# Patient Record
Sex: Male | Born: 1940 | Race: White | Hispanic: No | Marital: Married | Smoking: Former smoker
Health system: Southern US, Community
[De-identification: ages and names within clinical notes are randomized; demographics above are authoritative.]

## PROBLEM LIST (undated history)

## (undated) DIAGNOSIS — M1990 Unspecified osteoarthritis, unspecified site: Secondary | ICD-10-CM

## (undated) DIAGNOSIS — M3320 Polymyositis, organ involvement unspecified: Secondary | ICD-10-CM

## (undated) DIAGNOSIS — H9190 Unspecified hearing loss, unspecified ear: Secondary | ICD-10-CM

## (undated) HISTORY — PX: APPENDECTOMY: SHX54

---

## 2012-01-15 ENCOUNTER — Ambulatory Visit: Payer: Self-pay | Admitting: Unknown Physician Specialty

## 2014-02-18 ENCOUNTER — Ambulatory Visit: Payer: Self-pay | Admitting: Internal Medicine

## 2014-07-29 ENCOUNTER — Other Ambulatory Visit
Admit: 2014-07-29 | Disposition: A | Discharge status: Home / Self Care | Payer: Self-pay | Attending: Internal Medicine | Admitting: Internal Medicine

## 2014-07-29 ENCOUNTER — Ambulatory Visit
Admit: 2014-07-29 | Disposition: A | Discharge status: Home / Self Care | Payer: Self-pay | Attending: Internal Medicine | Admitting: Internal Medicine

## 2014-07-29 LAB — COMPREHENSIVE METABOLIC PANEL
ALBUMIN: 4.6 g/dL
ALT: 16 U/L — AB
AST: 22 U/L
Alkaline Phosphatase: 82 U/L
Anion Gap: 11 (ref 7–16)
BUN: 12 mg/dL
Bilirubin,Total: 0.5 mg/dL
CALCIUM: 9.5 mg/dL
Chloride: 101 mmol/L
Co2: 27 mmol/L
Creatinine: 0.82 mg/dL
EGFR (Non-African Amer.): >60
GLUCOSE: 85 mg/dL
Potassium: 4.3 mmol/L
Sodium: 139 mmol/L
TOTAL PROTEIN: 7.4 g/dL

## 2015-04-09 ENCOUNTER — Inpatient Hospital Stay
Admission: EM | Admit: 2015-04-09 | Discharge: 2015-04-13 | DRG: 379 | Disposition: A | Discharge status: Home / Self Care | Payer: Medicare Other | Attending: Internal Medicine | Admitting: Internal Medicine

## 2015-04-09 ENCOUNTER — Encounter: Payer: Self-pay | Admitting: *Deleted

## 2015-04-09 DIAGNOSIS — R42 Dizziness and giddiness: Secondary | ICD-10-CM | POA: Diagnosis not present

## 2015-04-09 DIAGNOSIS — K5711 Diverticulosis of small intestine without perforation or abscess with bleeding: Secondary | ICD-10-CM | POA: Diagnosis not present

## 2015-04-09 DIAGNOSIS — T39395A Adverse effect of other nonsteroidal anti-inflammatory drugs [NSAID], initial encounter: Secondary | ICD-10-CM | POA: Diagnosis present

## 2015-04-09 DIAGNOSIS — D649 Anemia, unspecified: Secondary | ICD-10-CM | POA: Diagnosis present

## 2015-04-09 DIAGNOSIS — Z7982 Long term (current) use of aspirin: Secondary | ICD-10-CM

## 2015-04-09 DIAGNOSIS — Z87891 Personal history of nicotine dependence: Secondary | ICD-10-CM

## 2015-04-09 DIAGNOSIS — R000 Tachycardia, unspecified: Secondary | ICD-10-CM

## 2015-04-09 DIAGNOSIS — K921 Melena: Secondary | ICD-10-CM | POA: Diagnosis present

## 2015-04-09 DIAGNOSIS — D72829 Elevated white blood cell count, unspecified: Secondary | ICD-10-CM | POA: Diagnosis present

## 2015-04-09 DIAGNOSIS — M1611 Unilateral primary osteoarthritis, right hip: Secondary | ICD-10-CM | POA: Diagnosis present

## 2015-04-09 DIAGNOSIS — F439 Reaction to severe stress, unspecified: Secondary | ICD-10-CM | POA: Diagnosis present

## 2015-04-09 DIAGNOSIS — K2970 Gastritis, unspecified, without bleeding: Secondary | ICD-10-CM | POA: Diagnosis present

## 2015-04-09 DIAGNOSIS — K449 Diaphragmatic hernia without obstruction or gangrene: Secondary | ICD-10-CM | POA: Diagnosis present

## 2015-04-09 HISTORY — DX: Unspecified osteoarthritis, unspecified site: M19.90

## 2015-04-09 LAB — CBC
HCT: 33.8 % — ABNORMAL LOW (ref 40.0–52.0)
Hemoglobin: 11.1 g/dL — ABNORMAL LOW (ref 13.0–18.0)
MCH: 29.5 pg (ref 26.0–34.0)
MCHC: 32.9 g/dL (ref 32.0–36.0)
MCV: 89.7 fL (ref 80.0–100.0)
PLATELETS: 427 10*3/uL (ref 150–440)
RBC: 3.77 MIL/uL — ABNORMAL LOW (ref 4.40–5.90)
RDW: 13.2 % (ref 11.5–14.5)
WBC: 14.6 10*3/uL — AB (ref 3.8–10.6)

## 2015-04-09 LAB — BASIC METABOLIC PANEL
Anion gap: 9 (ref 5–15)
BUN: 38 mg/dL — AB (ref 6–20)
CO2: 27 mmol/L (ref 22–32)
CREATININE: 0.84 mg/dL (ref 0.61–1.24)
Calcium: 8.8 mg/dL — ABNORMAL LOW (ref 8.9–10.3)
Chloride: 103 mmol/L (ref 101–111)
GFR calc Af Amer: >60 mL/min (ref >60)
Glucose, Bld: 113 mg/dL — ABNORMAL HIGH (ref 65–99)
POTASSIUM: 3.7 mmol/L (ref 3.5–5.1)
SODIUM: 139 mmol/L (ref 135–145)

## 2015-04-09 LAB — PROTIME-INR
INR: 1.03
Prothrombin Time: 13.7 seconds (ref 11.4–15.0)

## 2015-04-09 LAB — APTT: aPTT: 29 seconds (ref 24–36)

## 2015-04-09 LAB — HEMOGLOBIN: Hemoglobin: 9.5 g/dL — ABNORMAL LOW (ref 13.0–18.0)

## 2015-04-09 LAB — ABO/RH: ABO/RH(D): A POS

## 2015-04-09 MED ORDER — ONDANSETRON HCL 4 MG PO TABS: 4.0000 mg | ORAL_TABLET | Freq: Four times a day (QID) | ORAL | Status: DC | PRN

## 2015-04-09 MED ORDER — SODIUM CHLORIDE 0.9 % IJ SOLN
3.0000 mL | Freq: Two times a day (BID) | INTRAMUSCULAR | Status: DC
  Administered 2015-04-09 – 2015-04-12 (×2): 3 mL via INTRAVENOUS

## 2015-04-09 MED ORDER — ONDANSETRON HCL 4 MG/2ML IJ SOLN: 4.0000 mg | Freq: Four times a day (QID) | INTRAMUSCULAR | Status: DC | PRN

## 2015-04-09 MED ORDER — SODIUM CHLORIDE 0.9 % IV SOLN
8.0000 mg/h | INTRAVENOUS | Status: DC
  Administered 2015-04-09 – 2015-04-12 (×5): 8 mg/h via INTRAVENOUS
  Filled 2015-04-09 (×6): qty 80

## 2015-04-09 MED ORDER — SODIUM CHLORIDE 0.9 % IV BOLUS (SEPSIS)
1000.0000 mL | Freq: Once | INTRAVENOUS | Status: AC
Start: 2015-04-09 — End: 2015-04-09
  Administered 2015-04-09: 1000 mL via INTRAVENOUS

## 2015-04-09 MED ORDER — OXYCODONE HCL 5 MG PO TABS: 5.0000 mg | ORAL_TABLET | ORAL | Status: DC | PRN

## 2015-04-09 MED ORDER — ACETAMINOPHEN 325 MG PO TABS
650.0000 mg | ORAL_TABLET | Freq: Four times a day (QID) | ORAL | Status: DC | PRN
  Administered 2015-04-10: 650 mg via ORAL
  Filled 2015-04-09: qty 2

## 2015-04-09 MED ORDER — SODIUM CHLORIDE 0.9 % IV SOLN: 80.0000 mg | Freq: Once | INTRAVENOUS | Status: DC

## 2015-04-09 MED ORDER — SODIUM CHLORIDE 0.9 % IV SOLN
80.0000 mg | Freq: Once | INTRAVENOUS | Status: AC
  Administered 2015-04-09: 80 mg via INTRAVENOUS
  Filled 2015-04-09: qty 80

## 2015-04-09 MED ORDER — SODIUM CHLORIDE 0.9 % IV SOLN
INTRAVENOUS | Status: DC
  Administered 2015-04-09 – 2015-04-11 (×5): via INTRAVENOUS
  Administered 2015-04-11: 1000 mL via INTRAVENOUS
  Administered 2015-04-12 – 2015-04-13 (×3): via INTRAVENOUS

## 2015-04-09 MED ORDER — ACETAMINOPHEN 650 MG RE SUPP: 650.0000 mg | Freq: Four times a day (QID) | RECTAL | Status: DC | PRN

## 2015-04-09 NOTE — ED Provider Notes (Signed)
[facility] Emergency Department Provider Note  ____________________________________________  Time seen: Approximately 1:08 PM  I have reviewed the triage vital signs and the nursing notes.   HISTORY  Chief Complaint Rectal Bleeding    HPI Charles Davila is a 74 y.o. male presenting with lightheadedness and melena. Patient states that he takes a lot of aspirin and Aleve. This morning he bent over to put some wood chips and a dog house and had acute lightheadedness. He drove home but the lightheadedness persisted, and was followed by a large dark and tarry stool. No bright red blood. No recent epigastric pain. No fever, abdominal pain, chest pain, palpitations, shortness of breath.   History reviewed. No pertinent past medical history.  There are no active problems to display for this patient.   Past Surgical History  Procedure Laterality Date  . Appendectomy      No current outpatient prescriptions on file.  Allergies Review of patient's allergies indicates no known allergies.  No family history on file.  Social History Social History  Substance Use Topics  . Smoking status: Former Games developer  . Smokeless tobacco: None  . Alcohol Use: No    Review of Systems Constitutional: No fever/chills. Positive lightheadedness. Eyes: No visual changes. ENT: No sore throat. Cardiovascular: Denies chest pain, palpitations. Respiratory: Denies shortness of breath.  No cough. Gastrointestinal: No abdominal pain.  No nausea, no vomiting.  No diarrhea.  No constipation. Positive melena. Genitourinary: Negative for dysuria. Musculoskeletal: Negative for back pain. Skin: Negative for rash. Neurological: Negative for headaches, focal weakness or numbness.  10-point ROS otherwise negative.  ____________________________________________   PHYSICAL EXAM:  VITAL SIGNS: ED Triage Vitals  Enc Vitals Group     BP 04/09/15 1240 155/86 mmHg     Pulse Rate  04/09/15 1240 114     Resp 04/09/15 1240 20     Temp 04/09/15 1240 97.6 F (36.4 C)     Temp Source 04/09/15 1240 Oral     SpO2 04/09/15 1240 98 %     Weight 04/09/15 1240 160 lb (72.576 kg)     Height 04/09/15 1240  (1.727 m)     Head Cir --      Peak Flow --      Pain Score --      Pain Loc --      Pain Edu? --      Excl. in GC? --     Constitutional: Alert and oriented. Well appearing and in no acute distress. Answer question appropriately. Eyes: Conjunctivae are normal.  EOMI. no conjunctival pallor. Head: Atraumatic. Nose: No congestion/rhinnorhea. Mouth/Throat: Mucous membranes are moist.  Neck: No stridor.  Supple.   Cardiovascular: Normal rate, regular rhythm. No murmurs, rubs or gallops.  Respiratory: Normal respiratory effort.  No retractions. Lungs CTAB.  No wheezes, rales or ronchi. Gastrointestinal: Soft and nontender. No distention. No peritoneal signs. Genitourinary: No evidence of external or internal hemorrhoids. Non-tender rectal exam with resulting black stool that is guaiac positive. Musculoskeletal: No LE edema.  Neurologic:  Normal speech and language. No gross focal neurologic deficits are appreciated.  Skin:  Skin is warm, dry and intact. No rash noted. No pallor Psychiatric: Mood and affect are normal. Speech and behavior are normal.  Normal judgement.  ____________________________________________   LABS (all labs ordered are listed, but only abnormal results are displayed)  Labs Reviewed  CBC  BASIC METABOLIC PANEL  APTT  PROTIME-INR  TYPE AND SCREEN   ____________________________________________  EKG  ED ECG REPORT I, Rockne MenghiniNorman, Anne-Caroline, the attending physician, personally viewed and interpreted this ECG.   Date: 04/09/2015  EKG Time: 156   Rate:  99  Rhythm: normal sinus rhythm  Axis: Leftward  Intervals:none  ST&T Change: Unspecific T-wave inversion in V1. No ST  elevation.  ____________________________________________  RADIOLOGY  No results found.  ____________________________________________   PROCEDURES  Procedure(s) performed: None  Critical Care performed: No ____________________________________________   INITIAL IMPRESSION / ASSESSMENT AND PLAN / ED COURSE  Pertinent labs & imaging results that were available during my care of the patient were reviewed by me and considered in my medical decision making (see chart for details).  74 y.o. male with frequent aspirin and Aleve use presenting with positional lightheadedness and one episode of melena. The patient is hemodynamically stable and has a repeat heart rate in the 90s. He has a blood pressure 155/86. I will check him for anemia, and plan to admit him to the hospital. Will initiate Protonix bolus and infusion.  ----------------------------------------- 1:51 PM on 04/09/2015 ----------------------------------------- Patient does have a mildly anemia but still has a hemoglobin of 11.1 so transfusion is not indicated at this time. The Protonix bolus and drip had been initiated. I have admitted to the patient to the hospital he is still tachycardic but maintaining his blood pressure.  ____________________________________________  FINAL CLINICAL IMPRESSION(S) / ED DIAGNOSES  Final diagnoses:  Melena  Postural lightheadedness      NEW MEDICATIONS STARTED DURING THIS VISIT:  New Prescriptions   No medications on file     Rockne MenghiniAnne-Caroline Benard Minturn, MD 04/09/15 1352

## 2015-04-09 NOTE — ED Notes (Signed)
This am got dizzy, , had black stools today, has had abd pain

## 2015-04-09 NOTE — H&P (Signed)
[facility] [place] [facility]   PATIENT NAME: Charles BargeViesturs Davila    MR#:  [id number]  DATE OF BIRTH:  1940-04-28  DATE OF ADMISSION:  04/09/2015  PRIMARY CARE PHYSICIAN: Lynnea FerrierBERT J KLEIN III, MD   REQUESTING/REFERRING PHYSICIAN: Dr. Rockne MenghiniAnne-Caroline Norman  CHIEF COMPLAINT:   Chief Complaint  Patient presents with  . Rectal Bleeding    HISTORY OF PRESENT ILLNESS:  Kregg Mallie SnooksKronbergs  is a 74 y.o. male with a known history of arthritis taking aspirin and Aleve at home comes to the emergency room secondary to an episode of lightheadedness that happened this morning. Patient was diagnosed with right flank/hip arthritis and has been taking aspirin 325 mg, 2 tablets in the morning and 2 tablets at nighttime. He was taking them every day and also takes a tablet of Aleve once in the morning and also at bedtime. He hasn't had any prior history of GI bleed, epigastric pain or abdominal pain or melanotic stools. He felt too weak yesterday. He is pretty active at baseline and went to work this morning, but felt very lightheaded and felt like he was not walking in a straight line. He went home and then had a huge dark bloody bowel movement. Then he presented to the emergency room. Denies any nausea, vomiting or hematemesis. hemoglobin is upto  11 here. No active bleeding noted. Stool guiaic is positive in the ER.  PAST MEDICAL HISTORY:   Past Medical History  Diagnosis Date  . Arthritis     PAST SURGICAL HISTORY:   Past Surgical History  Procedure Laterality Date  . Appendectomy      SOCIAL HISTORY:   Social History  Substance Use Topics  . Smoking status: Never Smoker   . Smokeless tobacco: Not on file  . Alcohol Use: No     Comment: 1 beer every night    FAMILY HISTORY:  No family history on file.  DRUG ALLERGIES:  No Known Allergies  REVIEW OF SYSTEMS:   Review of Systems  Constitutional: Positive for malaise/fatigue. Negative for fever, chills  and weight loss.  HENT: Negative for ear discharge, ear pain, hearing loss, nosebleeds and tinnitus.   Eyes: Negative for blurred vision, double vision and photophobia.  Respiratory: Negative for cough, hemoptysis, shortness of breath and wheezing.   Cardiovascular: Negative for chest pain, palpitations, orthopnea and leg swelling.  Gastrointestinal: Negative for heartburn, nausea, vomiting, abdominal pain, diarrhea, constipation and melena.  Genitourinary: Negative for dysuria, urgency, frequency and hematuria.  Musculoskeletal: Negative for myalgias, back pain and neck pain.  Skin: Negative for rash.  Neurological: Positive for dizziness. Negative for tingling, tremors, sensory change, speech change, focal weakness and headaches.  Endo/Heme/Allergies: Does not bruise/bleed easily.  Psychiatric/Behavioral: Negative for depression.    MEDICATIONS AT HOME:   Prior to Admission medications   Medication Sig Start Date End Date Taking? Authorizing Provider  aspirin EC 325 MG tablet Take 325 mg by mouth daily.   Yes Historical Provider, MD  naproxen sodium (ANAPROX) 220 MG tablet Take 220 mg by mouth 2 (two) times daily as needed (for pain).   Yes Historical Provider, MD  vardenafil (LEVITRA) 20 MG tablet Take 20 mg by mouth as needed for erectile dysfunction.   Yes Historical Provider, MD      VITAL SIGNS:  Blood pressure 150/89, pulse 129, temperature 97.6 F (36.4 C), temperature source Oral, resp. rate 14, height 5\' 8"  (1.727 m), weight 72.576 kg (160 lb), SpO2 99 %.  PHYSICAL  EXAMINATION:   Physical Exam  GENERAL:  74 y.o.-year-old patient lying in the bed with no acute distress.  EYES: Pupils equal, round, reactive to light and accommodation. No scleral icterus. Extraocular muscles intact.  HEENT: Head atraumatic, normocephalic. Oropharynx and nasopharynx clear.  NECK:  Supple, no jugular venous distention. No thyroid enlargement, no tenderness.  LUNGS: Normal breath sounds  bilaterally, no wheezing, rales,rhonchi or crepitation. No use of accessory muscles of respiration.  CARDIOVASCULAR: S1, S2 normal. No murmurs, rubs, or gallops.  ABDOMEN: Soft, nontender, nondistended. Bowel sounds present. No organomegaly or mass.  EXTREMITIES: No pedal edema, cyanosis, or clubbing.  NEUROLOGIC: Cranial nerves II through XII are intact. Muscle strength 5/5 in all extremities. Sensation intact. Gait not checked.  PSYCHIATRIC: The patient is alert and oriented x 3.  SKIN: No obvious rash, lesion, or ulcer.   LABORATORY PANEL:   CBC  Recent Labs Lab 04/09/15 1302  WBC 14.6*  HGB 11.1*  HCT 33.8*  PLT 427   ------------------------------------------------------------------------------------------------------------------  Chemistries   Recent Labs Lab 04/09/15 1302  NA 139  K 3.7  CL 103  CO2 27  GLUCOSE 113*  BUN 38*  CREATININE 0.84  CALCIUM 8.8*   ------------------------------------------------------------------------------------------------------------------  Cardiac Enzymes No results for input(s): TROPONINI in the last 168 hours. ------------------------------------------------------------------------------------------------------------------  RADIOLOGY:  No results found.  EKG:  No orders found for this or any previous visit.  IMPRESSION AND PLAN:   Arjan Strohm  is a 74 y.o. male with a known history of arthritis taking aspirin and Aleve at home comes to the emergency room secondary to an episode of lightheadedness that happened this morning.  #1 Melena- hb stable, admit under observation - cont IV protonix - likely asa and NSAID induced gastritis or ulcer - clear liquid diet - Hb q8h and GI consult - EGD either as inpatient or outpatient - stop asa and NSAIDS  #2 Arthritis- oxycodone prn for now  #3 DVT Prophylaxis- TEDs. SCDs Avoid heparin products for now  #4 Leukocytosis- likely stress reaction. Monitor    All the  records are reviewed and case discussed with ED provider. Management plans discussed with the patient, family and they are in agreement.  CODE STATUS: Full Code  TOTAL TIME TAKING CARE OF THIS PATIENT: 50 minutes.    Enid Baas M.D on 04/09/2015 at 2:54 PM  Between 7am to 6pm - Pager - [phone]  After 6pm go to [email or web address] - password [email or web address] [facility]  [place] [facility]  Office  [phone]  CC: Primary care physician; Lynnea Ferrier, MD

## 2015-04-09 NOTE — Care Management Obs Status (Signed)
MEDICARE OBSERVATION STATUS NOTIFICATION   Patient Details  Name: Charles Davila MRN: [id number] Date of Birth: [birth year]   Medicare Observation Status Notification Given:  Yes Obsv letter to pt. And spouse at bedside.    Berna Bueheryl Ellard Nan, RN 04/09/2015, 2:39 PM

## 2015-04-10 LAB — CBC
HCT: 23.5 % — ABNORMAL LOW (ref 40.0–52.0)
Hemoglobin: 8 g/dL — ABNORMAL LOW (ref 13.0–18.0)
MCH: 30.4 pg (ref 26.0–34.0)
MCHC: 34 g/dL (ref 32.0–36.0)
MCV: 89.4 fL (ref 80.0–100.0)
PLATELETS: 308 10*3/uL (ref 150–440)
RBC: 2.63 MIL/uL — AB (ref 4.40–5.90)
RDW: 13.4 % (ref 11.5–14.5)
WBC: 6.8 10*3/uL (ref 3.8–10.6)

## 2015-04-10 LAB — PREPARE RBC (CROSSMATCH)

## 2015-04-10 LAB — BASIC METABOLIC PANEL
Anion gap: 3 — ABNORMAL LOW (ref 5–15)
BUN: 29 mg/dL — ABNORMAL HIGH (ref 6–20)
CALCIUM: 8.1 mg/dL — AB (ref 8.9–10.3)
CO2: 26 mmol/L (ref 22–32)
CREATININE: 0.78 mg/dL (ref 0.61–1.24)
Chloride: 110 mmol/L (ref 101–111)
Glucose, Bld: 108 mg/dL — ABNORMAL HIGH (ref 65–99)
Potassium: 3.9 mmol/L (ref 3.5–5.1)
SODIUM: 139 mmol/L (ref 135–145)

## 2015-04-10 LAB — HEMOGLOBIN
HEMOGLOBIN: 7 g/dL — AB (ref 13.0–18.0)
Hemoglobin: 7.8 g/dL — ABNORMAL LOW (ref 13.0–18.0)

## 2015-04-10 MED ORDER — SUCRALFATE 1 G PO TABS
1.0000 g | ORAL_TABLET | Freq: Three times a day (TID) | ORAL | Status: DC
  Administered 2015-04-10 – 2015-04-13 (×7): 1 g via ORAL
  Filled 2015-04-10 (×7): qty 1

## 2015-04-10 MED ORDER — SODIUM CHLORIDE 0.9 % IV SOLN
Freq: Once | INTRAVENOUS | Status: AC
  Administered 2015-04-11: 08:00:00 via INTRAVENOUS

## 2015-04-10 NOTE — Consult Note (Signed)
GI Inpatient Consult Note  Reason for Consult: Melena    History of Present Illness: Charles Davila is a 74 y.o. male who reports he started feeling weak yesterday afternoon. Had a bowel movement that was tar black in color.  He presented to the emergency department was found to have heme-positive stool and hemoglobin of 9.5.  He is a very active 74 year old.  Does report that he has chronic pain from arthritis and will experience lower abdominal pain as well.  He endorses using up to 4 full strength aspirin and 2 Aleve on an almost daily basis for the past year.  He denies any heartburn or acid reflux, denies any epigastric abdominal pain. Does not take a daily PPI.  Denies nausea or vomiting, has not had any further bowel movements since admission.  His last colonoscopy was January 15, 2012.  Dr. Mechele Collin.  Indications; screening for colorectal malignant neoplasm.  Impression ; diverticulosis in the sigmoid colon.  Internal hemorrhoids.  Examination was otherwise normal.  Repeat in 10 years for screening purposes.  He did have a CT of his abdomen and pelvis completed in April, 2016 that was unremarkable.  Was noted at that time that his pain may be coming from arthritis and that is when he started use of NSAIDs more.  Is tolerating clear liquids.  Past Medical History:  Past Medical History  Diagnosis Date  . Arthritis     Problem List: Patient Active Problem List   Diagnosis Date Noted  . Melena 04/09/2015    Past Surgical History: Past Surgical History  Procedure Laterality Date  . Appendectomy      Allergies: No Known Allergies  Home Medications: Prescriptions prior to admission  Medication Sig Dispense Refill Last Dose  . aspirin EC 325 MG tablet Take 325 mg by mouth daily.   04/09/2015 at 0800  . naproxen sodium (ANAPROX) 220 MG tablet Take 220 mg by mouth 2 (two) times daily as needed (for pain).   Past Week at Unknown time  . vardenafil (LEVITRA) 20 MG tablet  Take 20 mg by mouth as needed for erectile dysfunction.   PRN at PRN   Home medication reconciliation was completed with the patient.   Scheduled Inpatient Medications:   . sodium chloride  3 mL Intravenous Q12H    Continuous Inpatient Infusions:   . sodium chloride 100 mL/hr at 04/10/15 0329  . pantoprozole (PROTONIX) infusion 8 mg/hr (04/09/15 1445)    PRN Inpatient Medications:  acetaminophen **OR** acetaminophen, ondansetron **OR** ondansetron (ZOFRAN) IV, oxyCODONE  Family History: family history is not on file.   Social History:   reports that he has never smoked. He does not have any smokeless tobacco history on file. He reports that he does not drink alcohol.   Review of Systems: Constitutional: Weight is stable.  Eyes: No changes in vision. ENT: No oral lesions, sore throat.  GI: see HPI.  Heme/Lymph: No easy bruising.  CV: No chest pain.  GU: No hematuria.  Integumentary: No rashes.  Neuro: No headaches.  Psych: No depression/anxiety.  Endocrine: No heat/cold intolerance.  Allergic/Immunologic: No urticaria.  Resp: No cough, SOB.  Musculoskeletal: No joint swelling.    Physical Examination: BP 117/63 mmHg  Pulse 96  Temp(Src) 97.9 F (36.6 C) (Oral)  Resp 16  Ht  (1.727 m)  Wt 72.576 kg (160 lb)  BMI 24.33 kg/m2  SpO2 100% Gen: NAD, alert and oriented x 4, wife is present HEENT: PEERLA, EOMI, Neck: supple, no JVD  or thyromegaly Chest: CTA bilaterally, no wheezes, crackles, or other adventitious sounds CV: RRR, no m/g/c/r Abd: soft, slight lower abdominal tenderness, ND, +BS in all four quadrants; no HSM, guarding, ridigity, or rebound tenderness Ext: no edema, well perfused with 2+ pulses, Skin: no rash or lesions noted Lymph: no LAD  Data: Lab Results  Component Value Date   WBC 6.8 04/10/2015   HGB 8.0* 04/10/2015   HCT 23.5* 04/10/2015   MCV 89.4 04/10/2015   PLT 308 04/10/2015    Recent Labs Lab 04/09/15 1640 04/10/15 0023  04/10/15 0808  HGB 9.5* 7.8* 8.0*   Lab Results  Component Value Date   NA 139 04/10/2015   K 3.9 04/10/2015   CL 110 04/10/2015   CO2 26 04/10/2015   BUN 29* 04/10/2015   CREATININE 0.78 04/10/2015   Lab Results  Component Value Date   ALT 16* 07/29/2014   AST 22 07/29/2014   ALKPHOS 82 07/29/2014   BILITOT 0.5 07/29/2014    Recent Labs Lab 04/09/15 1302  APTT 29  INR 1.03    Assessment/Plan: Charles Davila is a 74 y.o. male with one episode of melena,heme + stool, hgb 11.1 on admission, 8.0 currently, was 15.4 in July 2016.  Bun /creatinine 29/0.78.  Is being treated with Protonix 40 MG BID.  Recommendations: We will proceed with an upper endoscopy tomorrow morning.  Continue with clear liquids until midnight.  Continue with Protonix.  Recommend adding carafate slurry QID.  Will need to follow up in our office after discharge.  We will continue to follow with you. Thank you for the consult. Please call with questions or concerns.  Carney Harderari S Richards, PA-C  I personally performed these services.

## 2015-04-10 NOTE — Progress Notes (Addendum)
Notified Dr. Nemiah CommanderKalisetti of hemoglobin of 7. Per Dr. Nemiah CommanderKalisetti place order to transfuse 1 unit of blood as well as draw a CBC at 10pm tonight. Have one unit on standby.

## 2015-04-10 NOTE — Progress Notes (Signed)
[facility] [place] [facility]   PATIENT NAME: Charles Davila Okey    MR#:  [id number]  DATE OF BIRTH:  05/02/1940  SUBJECTIVE:  CHIEF COMPLAINT:   Chief Complaint  Patient presents with  . Rectal Bleeding   -Patient admitted with lightheadedness and a large melanotic stool. Hasn't had further bowel movements since admission. -Not tachycardic and blood pressure is better. Not orthostatic today. -However hemoglobin dropped from 11 yesterday to 8 this morning  REVIEW OF SYSTEMS:  Review of Systems  Constitutional: Negative for fever and chills.  HENT: Negative for ear discharge, ear pain and nosebleeds.   Eyes: Negative for blurred vision and double vision.  Respiratory: Negative for cough, shortness of breath and wheezing.   Cardiovascular: Negative for chest pain and palpitations.  Gastrointestinal: Negative for nausea, vomiting, abdominal pain, diarrhea and constipation.  Genitourinary: Negative for dysuria.  Neurological: Negative for dizziness, speech change, focal weakness, seizures, weakness and headaches.  Psychiatric/Behavioral: Negative for depression.    DRUG ALLERGIES:  No Known Allergies  VITALS:  Blood pressure 117/63, pulse 96, temperature 97.9 F (36.6 C), temperature source Oral, resp. rate 16, height 5\' 8"  (1.727 m), weight 72.576 kg (160 lb), SpO2 100 %.  PHYSICAL EXAMINATION:  Physical Exam  GENERAL: 74 y.o.-year-old patient lying in the bed with no acute distress.  EYES: Pupils equal, round, reactive to light and accommodation. No scleral icterus. Extraocular muscles intact.  HEENT: Head atraumatic, normocephalic. Oropharynx and nasopharynx clear.  NECK: Supple, no jugular venous distention. No thyroid enlargement, no tenderness.  LUNGS: Normal breath sounds bilaterally, no wheezing, rales,rhonchi or crepitation. No use of accessory muscles of respiration.  CARDIOVASCULAR: S1, S2 normal. No murmurs, rubs, or gallops.   ABDOMEN: Soft, nontender, nondistended. Bowel sounds present. No organomegaly or mass.  EXTREMITIES: No pedal edema, cyanosis, or clubbing.  NEUROLOGIC: Cranial nerves II through XII are intact. Muscle strength 5/5 in all extremities. Sensation intact. Gait not checked.  PSYCHIATRIC: The patient is alert and oriented x 3.  SKIN: No obvious rash, lesion, or ulcer.   LABORATORY PANEL:   CBC  Recent Labs Lab 04/10/15 0808  WBC 6.8  HGB 8.0*  HCT 23.5*  PLT 308   ------------------------------------------------------------------------------------------------------------------  Chemistries   Recent Labs Lab 04/10/15 0808  NA 139  K 3.9  CL 110  CO2 26  GLUCOSE 108*  BUN 29*  CREATININE 0.78  CALCIUM 8.1*   ------------------------------------------------------------------------------------------------------------------  Cardiac Enzymes No results for input(s): TROPONINI in the last 168 hours. ------------------------------------------------------------------------------------------------------------------  RADIOLOGY:  No results found.  EKG:  No orders found for this or any previous visit.  ASSESSMENT AND PLAN:   Charles Davila Cromley is a 74 y.o. male with a known history of arthritis taking aspirin and Aleve at home comes to the emergency room secondary to an episode of lightheadedness and melena.  #1 Melena- hemoglobin dropped from 11-8 -No further active bleeding or melanotic stools noted. Blood pressure is stable. -- cont IV protonix - likely asa and NSAID induced gastritis or ulcer - Continue clear liquid diet - GI consult pending - Likely will need EGD - stop asa and NSAIDS  #2 Arthritis- oxycodone prn for now  #3 DVT Prophylaxis- TEDs. SCDs Avoid heparin products for now  #4 Leukocytosis- likely stress reaction. Resolved now    All the records are reviewed and case discussed with Care Management/Social Workerr. Management plans discussed  with the patient, family and they are in agreement.  CODE STATUS: Full Code  TOTAL  TIME TAKING CARE OF THIS PATIENT: 36 minutes.   POSSIBLE D/C IN 2 DAYS, DEPENDING ON CLINICAL CONDITION.   Enid Baas M.D on 04/10/2015 at 9:09 AM  Between 7am to 6pm - Pager - [phone]  After 6pm go to [email or web address] - password [email or web address]  [place] [facility]  Office  [phone]  CC: Primary care physician; Lynnea Ferrier, MD

## 2015-04-11 ENCOUNTER — Encounter: Payer: Self-pay | Admitting: Anesthesiology

## 2015-04-11 ENCOUNTER — Encounter
Admission: EM | Disposition: A | Discharge status: Home / Self Care | Payer: Self-pay | Source: Home / Self Care | Attending: Internal Medicine

## 2015-04-11 ENCOUNTER — Observation Stay: Payer: Medicare Other | Admitting: Certified Registered Nurse Anesthetist

## 2015-04-11 DIAGNOSIS — K449 Diaphragmatic hernia without obstruction or gangrene: Secondary | ICD-10-CM | POA: Diagnosis present

## 2015-04-11 DIAGNOSIS — Z87891 Personal history of nicotine dependence: Secondary | ICD-10-CM | POA: Diagnosis not present

## 2015-04-11 DIAGNOSIS — R42 Dizziness and giddiness: Secondary | ICD-10-CM | POA: Diagnosis present

## 2015-04-11 DIAGNOSIS — K5711 Diverticulosis of small intestine without perforation or abscess with bleeding: Secondary | ICD-10-CM | POA: Diagnosis present

## 2015-04-11 DIAGNOSIS — K2970 Gastritis, unspecified, without bleeding: Secondary | ICD-10-CM | POA: Diagnosis present

## 2015-04-11 DIAGNOSIS — M1611 Unilateral primary osteoarthritis, right hip: Secondary | ICD-10-CM | POA: Diagnosis present

## 2015-04-11 DIAGNOSIS — K921 Melena: Secondary | ICD-10-CM | POA: Diagnosis present

## 2015-04-11 DIAGNOSIS — D72829 Elevated white blood cell count, unspecified: Secondary | ICD-10-CM | POA: Diagnosis present

## 2015-04-11 DIAGNOSIS — D649 Anemia, unspecified: Secondary | ICD-10-CM | POA: Diagnosis present

## 2015-04-11 DIAGNOSIS — Z7982 Long term (current) use of aspirin: Secondary | ICD-10-CM | POA: Diagnosis not present

## 2015-04-11 DIAGNOSIS — F439 Reaction to severe stress, unspecified: Secondary | ICD-10-CM | POA: Diagnosis present

## 2015-04-11 DIAGNOSIS — T39395A Adverse effect of other nonsteroidal anti-inflammatory drugs [NSAID], initial encounter: Secondary | ICD-10-CM | POA: Diagnosis present

## 2015-04-11 HISTORY — PX: ESOPHAGOGASTRODUODENOSCOPY: SHX5428

## 2015-04-11 LAB — CBC
HEMATOCRIT: 22 % — AB (ref 40.0–52.0)
HEMATOCRIT: 22.6 % — AB (ref 40.0–52.0)
HEMOGLOBIN: 7.5 g/dL — AB (ref 13.0–18.0)
Hemoglobin: 7.7 g/dL — ABNORMAL LOW (ref 13.0–18.0)
MCH: 29.9 pg (ref 26.0–34.0)
MCH: 29.7 pg (ref 26.0–34.0)
MCHC: 33.9 g/dL (ref 32.0–36.0)
MCHC: 34 g/dL (ref 32.0–36.0)
MCV: 87.7 fL (ref 80.0–100.0)
MCV: 87.8 fL (ref 80.0–100.0)
PLATELETS: 276 10*3/uL (ref 150–440)
Platelets: 248 10*3/uL (ref 150–440)
RBC: 2.51 MIL/uL — AB (ref 4.40–5.90)
RBC: 2.57 MIL/uL — ABNORMAL LOW (ref 4.40–5.90)
RDW: 13.9 % (ref 11.5–14.5)
RDW: 14 % (ref 11.5–14.5)
WBC: 6.9 10*3/uL (ref 3.8–10.6)
WBC: 7.4 10*3/uL (ref 3.8–10.6)

## 2015-04-11 LAB — HEMOGLOBIN
HEMOGLOBIN: 7.2 g/dL — AB (ref 13.0–18.0)
Hemoglobin: 8.2 g/dL — ABNORMAL LOW (ref 13.0–18.0)
Hemoglobin: 8.1 g/dL — ABNORMAL LOW (ref 13.0–18.0)

## 2015-04-11 LAB — PREPARE RBC (CROSSMATCH)

## 2015-04-11 SURGERY — ESOPHAGOGASTRODUODENOSCOPY (EGD) WITH PROPOFOL
Anesthesia: General | Laterality: Left

## 2015-04-11 SURGERY — EGD (ESOPHAGOGASTRODUODENOSCOPY)
Anesthesia: General

## 2015-04-11 MED ORDER — PROPOFOL 500 MG/50ML IV EMUL
INTRAVENOUS | Status: DC | PRN
  Administered 2015-04-11: 125 ug/kg/min via INTRAVENOUS

## 2015-04-11 MED ORDER — FENTANYL CITRATE (PF) 100 MCG/2ML IJ SOLN: 25.0000 ug | INTRAMUSCULAR | Status: DC | PRN

## 2015-04-11 MED ORDER — SIMETHICONE 80 MG PO CHEW
160.0000 mg | CHEWABLE_TABLET | Freq: Once | ORAL | Status: AC
  Administered 2015-04-11: 160 mg via ORAL
  Filled 2015-04-11: qty 2

## 2015-04-11 MED ORDER — MIDAZOLAM HCL 5 MG/5ML IJ SOLN
INTRAMUSCULAR | Status: DC | PRN
  Administered 2015-04-11: 1 mg via INTRAVENOUS

## 2015-04-11 MED ORDER — ONDANSETRON HCL 4 MG/2ML IJ SOLN: 4.0000 mg | Freq: Once | INTRAMUSCULAR | Status: DC | PRN

## 2015-04-11 MED ORDER — EPHEDRINE SULFATE 50 MG/ML IJ SOLN
INTRAMUSCULAR | Status: DC | PRN
  Administered 2015-04-11: 5 mg via INTRAVENOUS
  Administered 2015-04-11: 10 mg via INTRAVENOUS

## 2015-04-11 MED ORDER — FENTANYL CITRATE (PF) 100 MCG/2ML IJ SOLN
INTRAMUSCULAR | Status: DC | PRN
  Administered 2015-04-11: 50 ug via INTRAVENOUS

## 2015-04-11 MED ORDER — SODIUM CHLORIDE 0.9 % IV SOLN
Freq: Once | INTRAVENOUS | Status: AC
  Administered 2015-04-11: 10:00:00 via INTRAVENOUS

## 2015-04-11 MED ORDER — SODIUM CHLORIDE 0.9 % IV SOLN
INTRAVENOUS | Status: DC
  Administered 2015-04-11: 08:00:00 via INTRAVENOUS

## 2015-04-11 MED ORDER — PROPOFOL 10 MG/ML IV BOLUS
INTRAVENOUS | Status: DC | PRN
  Administered 2015-04-11: 40 mg via INTRAVENOUS

## 2015-04-11 NOTE — Progress Notes (Signed)
[facility] - Charles Davila   PATIENT NAME: Charles Davila    MR#:  [id number]  DATE OF BIRTH:  [birth year]  SUBJECTIVE:  CHIEF COMPLAINT:   Chief Complaint  Patient presents with  . Rectal Bleeding   -Hemoglobin has been steadily dropping since yesterday. Received 1 unit packed RBC yesterday last night but hemoglobin stable around 7 today. -EGD done and showing blood in the duodenal diverticulum, but no active bleeding. One more unit being transfused today. Continue to recycle hemoglobins every 6 hours. -Surgical consult  REVIEW OF SYSTEMS:  Review of Systems  Constitutional: Negative for fever and chills.  HENT: Negative for ear discharge, ear pain and nosebleeds.   Eyes: Negative for blurred vision and double vision.  Respiratory: Negative for cough, shortness of breath and wheezing.   Cardiovascular: Negative for chest pain and palpitations.  Gastrointestinal: Negative for nausea, vomiting, abdominal pain, diarrhea and constipation.  Genitourinary: Negative for dysuria.  Neurological: Negative for dizziness, speech change, focal weakness, seizures, weakness and headaches.  Psychiatric/Behavioral: Negative for depression.    DRUG ALLERGIES:  No Known Allergies  VITALS:  Blood pressure 142/66, pulse 76, temperature 98.2 F (36.8 C), temperature source Oral, resp. rate 12, height  (1.727 m), weight 72.576 kg (160 lb), SpO2 99 %.  PHYSICAL EXAMINATION:  Physical Exam  GENERAL: 74 y.o.-year-old patient lying in the bed with no acute distress.  EYES: Pupils equal, round, reactive to light and accommodation. No scleral icterus. Extraocular muscles intact.  HEENT: Head atraumatic, normocephalic. Oropharynx and nasopharynx clear.  NECK: Supple, no jugular venous distention. No thyroid enlargement, no tenderness.  LUNGS: Normal breath sounds bilaterally, no wheezing, rales,rhonchi or crepitation. No use of accessory muscles of  respiration.  CARDIOVASCULAR: S1, S2 normal. No murmurs, rubs, or gallops.  ABDOMEN: Soft, nontender, nondistended. Bowel sounds present. No organomegaly or mass.  EXTREMITIES: No pedal edema, cyanosis, or clubbing.  NEUROLOGIC: Cranial nerves II through XII are intact. Muscle strength 5/5 in all extremities. Sensation intact. Gait not checked.  PSYCHIATRIC: The patient is alert and oriented x 3.  SKIN: No obvious rash, lesion, or ulcer.   LABORATORY PANEL:   CBC  Recent Labs Lab 04/11/15 0522 04/11/15 1004  WBC 6.9  --   HGB 7.5* 7.2*  HCT 22.0*  --   PLT 248  --    ------------------------------------------------------------------------------------------------------------------  Chemistries   Recent Labs Lab 04/10/15 0808  NA 139  K 3.9  CL 110  CO2 26  GLUCOSE 108*  BUN 29*  CREATININE 0.78  CALCIUM 8.1*   ------------------------------------------------------------------------------------------------------------------  Cardiac Enzymes No results for input(s): TROPONINI in the last 168 hours. ------------------------------------------------------------------------------------------------------------------  RADIOLOGY:  No results found.  EKG:  No orders found for this or any previous visit.  ASSESSMENT AND PLAN:   Charles Davila is a 74 y.o. male with a known history of arthritis taking aspirin and Aleve at home comes to the emergency room secondary to an episode of lightheadedness and melena.  #1 Melena- hemoglobin dropped and staying around 7. Received one unit transfusion yesterday, one more unit today. -EGD done today showing normal stomach and esophagus, but bleeding duodenal diverticulum. -No active bleeding noted. Continue to monitor hemoglobin every 6 hours and transfuse if hemoglobin is less than 7.5. -Blood pressure is stable, so can continue to monitor on the floor at this time. -- cont IV protonix - Continue nothing by mouth for now  and likely repeat EGD tomorrow with side-viewing scope - Appreciate GI  consult - consulted vascular and general surgery - stop asa and NSAIDS  #2 Arthritis- oxycodone prn for now  #3 DVT Prophylaxis- TEDs. SCDs Avoid heparin products for now  #4 Leukocytosis- likely stress reaction. Resolved now    All the records are reviewed and case discussed with Care Management/Social Workerr. Management plans discussed with the patient, family and they are in agreement.  CODE STATUS: Full Code  TOTAL TIME TAKING CARE OF THIS PATIENT: 36 minutes.   POSSIBLE D/C IN 2 DAYS, DEPENDING ON CLINICAL CONDITION.   Charles Davila,Charles Davila M.D on 04/11/2015 at 12:20 PM  Between 7am to 6pm - Pager - [phone]  After 6pm go to [email or web address] - password [email or web address] [facility]  [facility]  Office  [phone]  CC: Primary care physician; Charles FerrierBERT J KLEIN III, MD

## 2015-04-11 NOTE — OR Nursing (Signed)
Back to floor by floor orderly

## 2015-04-11 NOTE — Progress Notes (Signed)
Spoke with Dr. Mechele CollinElliott. Pt is only to have water. No other clear liquids.

## 2015-04-11 NOTE — Anesthesia Preprocedure Evaluation (Addendum)
Anesthesia Evaluation  Patient identified by MRN, date of birth, ID band Patient awake    Reviewed: Allergy & Precautions, NPO status , Patient's Chart, lab work & pertinent test results  Airway Mallampati: II  TM Distance: >3 FB Neck ROM: Full    Dental no notable dental hx.    Pulmonary neg pulmonary ROS,    Pulmonary exam normal breath sounds clear to auscultation       Cardiovascular negative cardio ROS Normal cardiovascular exam     Neuro/Psych negative neurological ROS  negative psych ROS   GI/Hepatic negative GI ROS, Neg liver ROS, Hx of melena   Endo/Other  negative endocrine ROS  Renal/GU negative Renal ROS  negative genitourinary   Musculoskeletal  (+) Arthritis , Osteoarthritis,    Abdominal Normal abdominal exam  (+)   Peds negative pediatric ROS (+)  Hematology  (+) anemia ,   Anesthesia Other Findings   Reproductive/Obstetrics                             Anesthesia Physical  Anesthesia Plan  ASA: II and emergent  Anesthesia Plan: General   Post-op Pain Management:    Induction: Intravenous  Airway Management Planned: Nasal Cannula  Additional Equipment:   Intra-op Plan:   Post-operative Plan:   Informed Consent: I have reviewed the patients History and Physical, chart, labs and discussed the procedure including the risks, benefits and alternatives for the proposed anesthesia with the patient or authorized representative who has indicated his/her understanding and acceptance.   Dental advisory given  Plan Discussed with: Surgeon and CRNA  Anesthesia Plan Comments:         Anesthesia Quick Evaluation  

## 2015-04-11 NOTE — Transfer of Care (Signed)
Immediate Anesthesia Transfer of Care Note  Patient: Charles Davila  Procedure(s) Performed: Procedure(s): ESOPHAGOGASTRODUODENOSCOPY (EGD) (N/A)  Patient Location: PACU  Anesthesia Type:General  Level of Consciousness: awake, alert  and oriented  Airway & Oxygen Therapy: Patient Spontanous Breathing and Patient connected to nasal cannula oxygen  Post-op Assessment: Report given to RN and Post -op Vital signs reviewed and stable  Post vital signs: Reviewed and stable  Last Vitals:  Filed Vitals:   04/11/15 0458 04/11/15 0847  BP: 138/61   Pulse: 82   Temp: 36.5 C 36.4 C  Resp: 18     Complications: No apparent anesthesia complications

## 2015-04-11 NOTE — Anesthesia Procedure Notes (Signed)
Date/Time: 04/11/2015 8:05 AM Performed by: Derinda LateIACONE, Buena Boehm Pre-anesthesia Checklist: Timeout performed, Patient being monitored, Emergency Drugs available, Suction available and Patient identified Patient Re-evaluated:Patient Re-evaluated prior to inductionOxygen Delivery Method: Nasal cannula Preoxygenation: Pre-oxygenation with 100% oxygen Intubation Type: IV induction

## 2015-04-11 NOTE — Progress Notes (Signed)
Per Dr. Girtha RmKalissetti will place order to check hemoglobin 1 hour after blood transfusion as well as every 6 hours for 4 times. Discontinue previous order.

## 2015-04-11 NOTE — Consult Note (Signed)
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  I could not find an active bleeding site despite through exam.  Not all of the diverticulum could be seen.   He needs further transfusions today, needs a general surgery consult in case he continues to bleed.  He needs a vascular surgery consult to see if arteriographic intervention will be of help.  Possible repeat EGD tomorrow with side viewing scope by Dr. Bluford Kaufmannh.  Will follow with you.

## 2015-04-11 NOTE — OR Nursing (Signed)
Patient is sleepy vss

## 2015-04-11 NOTE — Op Note (Signed)
[facility] Gastroenterology Patient Name: Charles BargeViesturs Kramp Procedure Date: 04/11/2015 7:49 AM MRN: [id number] Account #: [id number] Date of Birth: 1940-11-22 Admit Type: Outpatient Age: [age] [facility] END[facility]OM 4 Gender: Male Note Status: Finalized Procedure:         Upper GI endoscopy Indications:       Melena Providers:         Scot Junobert T. Elliott, MD Referring MD:      Daniel NonesBert Klein, MD (Referring MD) Medicines:         Propofol per Anesthesia Complications:     No immediate complications. Procedure:         Pre-Anesthesia Assessment:                    - After reviewing the risks and benefits, the patient was                     deemed in satisfactory condition to undergo the procedure.                    After obtaining informed consent, the endoscope was passed                     under direct vision. Throughout the procedure, the                     patient's blood pressure, pulse, and oxygen saturations                     were monitored continuously. The Endoscope was introduced                     through the mouth, and advanced to the third part of                     duodenum. The upper GI endoscopy was accomplished without                     difficulty. The patient tolerated the procedure well. Findings:      The examined esophagus was normal.      A small hiatus hernia was present.      Four small papules (nodules) with no bleeding and no stigmata of recent       bleeding were found in the gastric antrum.      The duodenal bulb was normal.      Red blood was found in the second part of the duodenum. Fluid aspiration       for cytology was performed.      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       Aspiration and lavage done and fresh blood was washed and part of the       diverticulum seen but not the upper inside portion, no actual bleeding       site seen, no accumulation of blood seen after initial lavage, no active        bleeding seen. Impression:        - Normal esophagus.                    - Small hiatus hernia.                    - Four papules (nodules) found in the stomach.                    -  Normal duodenal bulb.                    - Blood at 2nd part of the duodenum. Fluid aspiration                     performed.                    - Bleeding duodenal diverticulum. Recommendation:    - The findings and recommendations were discussed with the                     patient's family. Vascular and general surgery consult,                     consider repeat EGD tomorow witth side viewing scope.                     Given recent fresh blood he needs to get transfused more                     blood Scot Jun, MD 04/11/2015 8:44:13 AM This report has been signed electronically. Number of Addenda: 0 Note Initiated On: 04/11/2015 7:49 AM      [facility]

## 2015-04-11 NOTE — Consult Note (Signed)
[facility] Vascular Consult Note  MRN : [id number]  Charles Davila is a 74 y.o. (08-08-1940) male who presents with chief complaint of  Chief Complaint  Patient presents with  . Rectal Bleeding   History of Present Illness:  Charles Davila is a 74 y.o. male with a known history of arthritis taking aspirin and Aleve at home comes to the emergency room secondary to an episode of lightheadedness that happened on 04/09/15. Patient was diagnosed with right flank/hip arthritis and has been taking aspirin 325 mg, 2 tablets in the morning and 2 tablets at nighttime. He was taking them every day and also takes a tablet of Aleve once in the morning and also at bedtime. He hasn't had any prior history of GI bleed, epigastric pain or abdominal pain or melanotic stools. He is pretty active at baseline and went to work the morning of 04/09/15, but felt very lightheaded and felt like he was not walking in a straight line. He went home and then had a huge dark bloody bowel movement. Then he presented to the emergency room. Denies any nausea, vomiting or hematemesis. Stool guiaic is positive in the ER.  Endoscopy performed by gastroenterology on 04/11/15 showed fresh blood in 2nd portion of duodenum and a large diverticulum in distal 2nd portion. No active bleeding site found.Not all of the diverticulum could be seen.  Vascular surgery consulted by primary team Dr. Nemiah Commander for endovascular intervention in the setting of a upper GI bleed.  Current Facility-Administered Medications  Medication Dose Route Frequency Provider Last Rate Last Dose  . 0.9 %  sodium chloride infusion   Intravenous Continuous Enid Baas, MD 100 mL/hr at 04/11/15 0911 1,000 mL at 04/11/15 0911  . acetaminophen (TYLENOL) tablet 650 mg  650 mg Oral Q6H PRN Enid Baas, MD   650 mg at 04/10/15 1212   Or  . acetaminophen (TYLENOL) suppository 650 mg  650 mg Rectal Q6H PRN Enid Baas,  MD      . ondansetron (ZOFRAN) tablet 4 mg  4 mg Oral Q6H PRN Enid Baas, MD       Or  . ondansetron (ZOFRAN) injection 4 mg  4 mg Intravenous Q6H PRN Enid Baas, MD      . oxyCODONE (Oxy IR/ROXICODONE) immediate release tablet 5 mg  5 mg Oral Q4H PRN Enid Baas, MD      . pantoprazole (PROTONIX) 80 mg in sodium chloride 0.9 % 250 mL (0.32 mg/mL) infusion  8 mg/hr Intravenous Continuous Rockne Menghini, MD 25 mL/hr at 04/11/15 0734 8 mg/hr at 04/11/15 0734  . sodium chloride 0.9 % injection 3 mL  3 mL Intravenous Q12H Enid Baas, MD   3 mL at 04/09/15 2200  . sucralfate (CARAFATE) tablet 1 g  1 g Oral TID WC & HS Sung Amabile, PA-C   1 g at 04/10/15 2219    Past Medical History  Diagnosis Date  . Arthritis     Past Surgical History  Procedure Laterality Date  . Appendectomy      Social History Social History  Substance Use Topics  . Smoking status: Never Smoker   . Smokeless tobacco: None  . Alcohol Use: No     Comment: 1 beer every night    Family History History reviewed. No pertinent family history.  Denies any history of bleeding disorders, PAD or autoimmune disease.   No Known Allergies   REVIEW OF SYSTEMS (Negative unless checked)  Constitutional: Weight loss  Fever  Chills  Cardiac: Chest pain   Chest pressure   Palpitations   Shortness of breath when laying flat   Shortness of breath at rest   Shortness of breath with exertion. Vascular:  Pain in legs with walking   Pain in legs at rest   Pain in legs when laying flat   Claudication   Pain in feet when walking  Pain in feet at rest  Pain in feet when laying flat   History of DVT   Phlebitis   Swelling in legs   Varicose veins   Non-healing ulcers Pulmonary:   Uses home oxygen   Productive cough   Hemoptysis   Wheeze  COPD   Asthma Neurologic:  Dizziness  Blackouts   Seizures   History of stroke   History of TIA   Aphasia   Temporary blindness   Dysphagia   Weakness or numbness in arms   Weakness or numbness in legs Musculoskeletal:  Arthritis   Joint swelling   Joint pain   Low back pain Hematologic:  Easy bruising  Easy bleeding   Hypercoagulable state   Anemic  Hepatitis Gastrointestinal:  Blood in stool   Vomiting blood  Gastroesophageal reflux/heartburn   Difficulty swallowing. Genitourinary:  Chronic kidney disease   Difficult urination  Frequent urination  Burning with urination   Blood in urine Skin:  Rashes   Ulcers   Wounds Psychological:  History of anxiety    History of major depression.  Physical Examination  Filed Vitals:   04/11/15 0914 04/11/15 0945 04/11/15 1100 04/11/15 1130  BP:  142/68 132/67 142/66  Pulse: 75 76 71 76  Temp:  97.7 F (36.5 C) 98 F (36.7 C) 98.2 F (36.8 C)  TempSrc:  Oral Oral Oral  Resp: Height:      Weight:      SpO2: 96% 99% 99% 99%   Body mass index is 24.33 kg/(m^2). Gen:  WD/WN, NAD Head: [place]/AT, No temporalis wasting. Prominent temp pulse not noted. Ear/Nose/Throat: Hearing grossly intact, nares w/o erythema or drainage, oropharynx w/o Erythema/Exudate Eyes: PERRLA, EOMI.  Neck: Supple, no nuchal rigidity.  No bruit or JVD.  Pulmonary:  Good air movement, clear to auscultation bilaterally.  Cardiac: RRR, normal S1, S2, no Murmurs, rubs or gallops. Vascular:  Vessel Right Left  Radial Palpable Palpable  Ulnar Palpable Palpable  Brachial Palpable Palpable  Carotid Palpable, without bruit Palpable, without bruit  Aorta Not palpable N/A  Femoral Palpable Palpable  Popliteal Palpable Palpable  PT Palpable Palpable  DP Palpable Palpable   Gastrointestinal: soft, non-tender/non-distended. No guarding/reflex. No masses, surgical incisions, or scars. Musculoskeletal: M/S 5/5 throughout.  Extremities without ischemic changes.  No deformity or atrophy. No edema. Neurologic:  CN 2-12 intact. Pain and light touch intact in extremities.  Symmetrical.  Speech is fluent. Motor exam as listed above. Psychiatric: Judgment intact, Mood & affect appropriate for pt's clinical situation. Dermatologic: No rashes or ulcers noted.  No cellulitis or open wounds. Lymph : No Cervical, Axillary, or Inguinal lymphadenopathy.  CBC Lab Results  Component Value Date   WBC 6.9 04/11/2015   HGB 7.2* 04/11/2015   HCT 22.0* 04/11/2015   MCV 87.7 04/11/2015   PLT 248 04/11/2015   BMET    Component Value Date/Time   NA 139 04/10/2015 0808   NA 139 07/29/2014 0501   K 3.9 04/10/2015 0808   K 4.3 07/29/2014 0501   CL 110 04/10/2015 0808   CL 101 07/29/2014 0501  CO2 26 04/10/2015 0808   CO2 27 07/29/2014 0501   GLUCOSE 108* 04/10/2015 0808   GLUCOSE 85 07/29/2014 0501   BUN 29* 04/10/2015 0808   BUN 12 07/29/2014 0501   CREATININE 0.78 04/10/2015 0808   CREATININE 0.82 07/29/2014 0501   CALCIUM 8.1* 04/10/2015 0808   CALCIUM 9.5 07/29/2014 0501   GFRNONAA >60 04/10/2015 0808   GFRNONAA >60 07/29/2014 0501   GFRAA >60 04/10/2015 0808   GFRAA >60 07/29/2014 0501   Estimated Creatinine Clearance: 80.8 mL/min (by C-G formula based on Cr of 0.78).  COAG Lab Results  Component Value Date   INR 1.03 04/09/2015   Radiology No results found.  Assessment/Plan The patient is a 74 year old male with presents with upper GI bleeding and melena. 1) In the setting of an upper GI bleed endovascular intervention is not indicated due to the large anatomy of collaterals.  2) Continue serial H&H's 3) If patient continues to bleed he will most likely need surgical intervention.  Bayla Mcgovern A Riggin Cuttino, PA-C  04/11/2015 11:57 AM

## 2015-04-12 ENCOUNTER — Encounter
Admission: EM | Disposition: A | Discharge status: Home / Self Care | Payer: Self-pay | Source: Home / Self Care | Attending: Internal Medicine

## 2015-04-12 ENCOUNTER — Encounter: Payer: Self-pay | Admitting: Unknown Physician Specialty

## 2015-04-12 ENCOUNTER — Encounter: Payer: Self-pay | Admitting: Anesthesiology

## 2015-04-12 ENCOUNTER — Inpatient Hospital Stay: Payer: Medicare Other | Admitting: Anesthesiology

## 2015-04-12 DIAGNOSIS — K921 Melena: Secondary | ICD-10-CM

## 2015-04-12 HISTORY — PX: ESOPHAGOGASTRODUODENOSCOPY: SHX5428

## 2015-04-12 LAB — CBC
HCT: 21.9 % — ABNORMAL LOW (ref 40.0–52.0)
Hemoglobin: 7.7 g/dL — ABNORMAL LOW (ref 13.0–18.0)
MCH: 31.3 pg (ref 26.0–34.0)
MCHC: 35.2 g/dL (ref 32.0–36.0)
MCV: 88.9 fL (ref 80.0–100.0)
PLATELETS: 265 10*3/uL (ref 150–440)
RBC: 2.47 MIL/uL — AB (ref 4.40–5.90)
RDW: 14 % (ref 11.5–14.5)
WBC: 7.7 10*3/uL (ref 3.8–10.6)

## 2015-04-12 LAB — BASIC METABOLIC PANEL
Anion gap: 3 — ABNORMAL LOW (ref 5–15)
BUN: 19 mg/dL (ref 6–20)
CALCIUM: 8 mg/dL — AB (ref 8.9–10.3)
CO2: 27 mmol/L (ref 22–32)
Chloride: 110 mmol/L (ref 101–111)
Creatinine, Ser: 0.75 mg/dL (ref 0.61–1.24)
GFR calc Af Amer: >60 mL/min (ref >60)
GLUCOSE: 91 mg/dL (ref 65–99)
POTASSIUM: 3.6 mmol/L (ref 3.5–5.1)
SODIUM: 140 mmol/L (ref 135–145)

## 2015-04-12 LAB — PREPARE RBC (CROSSMATCH)

## 2015-04-12 SURGERY — EGD (ESOPHAGOGASTRODUODENOSCOPY)
Anesthesia: General | Laterality: Left

## 2015-04-12 SURGERY — EGD (ESOPHAGOGASTRODUODENOSCOPY)
Anesthesia: General

## 2015-04-12 MED ORDER — SODIUM CHLORIDE 0.9 % IV SOLN
INTRAVENOUS | Status: DC
  Administered 2015-04-12: 15:00:00 via INTRAVENOUS

## 2015-04-12 MED ORDER — LACTATED RINGERS IV SOLN
INTRAVENOUS | Status: DC | PRN
  Administered 2015-04-12: 16:00:00 via INTRAVENOUS

## 2015-04-12 MED ORDER — SIMETHICONE 80 MG PO CHEW
160.0000 mg | CHEWABLE_TABLET | Freq: Three times a day (TID) | ORAL | Status: DC | PRN
  Administered 2015-04-12: 160 mg via ORAL
  Filled 2015-04-12: qty 2
  Filled 2015-04-12: qty 1

## 2015-04-12 MED ORDER — ALPRAZOLAM 0.5 MG PO TABS
0.5000 mg | ORAL_TABLET | Freq: Every evening | ORAL | Status: DC | PRN
  Administered 2015-04-12: 0.5 mg via ORAL
  Filled 2015-04-12: qty 1

## 2015-04-12 MED ORDER — ALPRAZOLAM 0.5 MG PO TABS
0.5000 mg | ORAL_TABLET | Freq: Once | ORAL | Status: AC
  Administered 2015-04-12: 0.5 mg via ORAL
  Filled 2015-04-12: qty 1

## 2015-04-12 MED ORDER — PROPOFOL 10 MG/ML IV BOLUS
INTRAVENOUS | Status: DC | PRN
  Administered 2015-04-12: 10 mg via INTRAVENOUS
  Administered 2015-04-12 (×2): 20 mg via INTRAVENOUS

## 2015-04-12 MED ORDER — PANTOPRAZOLE SODIUM 40 MG IV SOLR
40.0000 mg | Freq: Two times a day (BID) | INTRAVENOUS | Status: DC
  Administered 2015-04-12: 40 mg via INTRAVENOUS
  Filled 2015-04-12: qty 40

## 2015-04-12 MED ORDER — SODIUM CHLORIDE 0.9 % IV SOLN
Freq: Once | INTRAVENOUS | Status: AC
  Administered 2015-04-12: 11:00:00 via INTRAVENOUS

## 2015-04-12 NOTE — Transfer of Care (Signed)
Im mediate Anesthesia Transfer of Care Note  Patient: Charles Davila  Procedure(s) Performed: Procedure(s): ESOPHAGOGASTRODUODENOSCOPY (EGD) (N/A)  Patient Location: PACU  Anesthesia Type:General  Level of Consciousness: awake, alert  and oriented  Airway & Oxygen Therapy: Patient Spontanous Breathing and Patient connected to nasal cannula oxygen  Post-op Assessment: Report given to RN and Post -op Vital signs reviewed and stable  Post vital signs: Reviewed and stable  Last Vitals:  Filed Vitals:   04/12/15 1603 04/12/15 1638  BP: 165/77 127/85  Pulse: 92 85  Temp: 36.6 C 36.1 C  Resp: 16 16    Complications: No apparent anesthesia complications

## 2015-04-12 NOTE — Care Management (Signed)
Spoke with patient, spouse and daughter. He is alert and oriented from home and uses no equipment or DME. Patient stated that he continues to drive self. Denies difficulty with medications. No CM needs Identified.

## 2015-04-12 NOTE — Op Note (Signed)
EGD done for Dr. Mechele CollinElliott using both gastroscope and duodenoscope. No active bleeding anywhere now. May have inflammatory polyps in antrum. Could have bled recently. 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 However, no bleeding now. No obvious sites of bleeding in duodenum at this point. Start clears. Continue to moniter hgb. Dr. Mechele CollinElliott to see pt tomorrow. Thanks.

## 2015-04-12 NOTE — Op Note (Signed)
[facility] Gastroenterology Patient Name: Charles Davila Procedure Date: 04/12/2015 4:15 PM MRN: [id number] Account #: [id number] Date of Birth: 09-15-1940 Admit Type: Inpatient Age: 74 Room: [facility] ENDO ROOM 4 Gender: Male Note Status: Finalized Procedure:         Upper GI endoscopy Indications:       Recent gastrointestinal bleeding Providers:         Ezzard Standing. Bluford Kaufmann, MD Referring MD:      Daniel Nones, MD (Referring MD) Medicines:         Monitored Anesthesia Care Complications:     No immediate complications. Procedure:         Pre-Anesthesia Assessment:                    - Prior to the procedure, a History and Physical was                     performed, and patient medications, allergies and                     sensitivities were reviewed. The patient's tolerance of                     previous anesthesia was reviewed.                    - The risks and benefits of the procedure and the sedation                     options and risks were discussed with the patient. All                     questions were answered and informed consent was obtained.                    - After reviewing the risks and benefits, the patient was                     deemed in satisfactory condition to undergo the procedure.                    After obtaining informed consent, the endoscope was passed                     under direct vision. Throughout the procedure, the                     patient's blood pressure, pulse, and oxygen saturations                     were monitored continuously. The Endoscope was introduced                     through the mouth, and advanced to the second part of                     duodenum. The upper GI endoscopy was accomplished without                     difficulty. The patient tolerated the procedure well. Findings:      The examined esophagus was normal.      Localized moderate inflammation characterized by inflammatory polyps was       found in  the gastric antrum.  The exam of the stomach was otherwise normal.      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 No       bleeding seen either witth gastroscope or duodenoscope.      The exam was otherwise without abnormality. Impression:        - Normal esophagus.                    - Gastritis.                    - Duodenal diverticulum.                    - The examination was otherwise normal.                    - No specimens collected. Recommendation:    - Observe patient's clinical course.                    - The findings and recommendations were discussed with the                     patient. 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 All rights reserved. The codes documented in this report are preliminary and upon coder review may  be revised to meet current compliance requirements. Wallace CullensPaul Y Lucerito Rosinski, MD 04/12/2015 4:35:40 PM This report has been signed electronically. Number of Addenda: 0 Note Initiated On: 04/12/2015 4:15 PM      [facility]

## 2015-04-12 NOTE — Anesthesia Postprocedure Evaluation (Signed)
Anesthesia Post Note  Patient: Charles Davila  Procedure(s) Performed: Procedure(s) (LRB): ESOPHAGOGASTRODUODENOSCOPY (EGD) (N/A)  Patient location during evaluation: Endoscopy Anesthesia Type: General Level of consciousness: awake and alert Pain management: pain level controlled Vital Signs Assessment: post-procedure vital signs reviewed and stable Respiratory status: spontaneous breathing, nonlabored ventilation, respiratory function stable and patient connected to nasal cannula oxygen Cardiovascular status: blood pressure returned to baseline and stable Postop Assessment: no signs of nausea or vomiting Anesthetic complications: no    Last Vitals:  Filed Vitals:   04/12/15 1708 04/12/15 1731  BP: 140/69 151/62  Pulse: 78 74  Temp:  36.8 C  Resp: 13 14    Last Pain:  Filed Vitals:   04/12/15 1731  PainSc: 0-No pain                 Cleda MccreedyJoseph K Piscitello

## 2015-04-12 NOTE — Progress Notes (Signed)
Dr Clint GuyHower notified of pt being anxious. Orders given.

## 2015-04-12 NOTE — OR Nursing (Signed)
Patient transferred back to Nursing Unit 2-C via hospital bed.  Verbal report given to Orvil FeilAbbie Goodman, RN.

## 2015-04-12 NOTE — Clinical Documentation Improvement (Signed)
Internal Medicine  Can the diagnosis of anemia be further specified? Please document findings in next progress note; not in BPA drop down box. Thank you!   Iron deficiency Anemia  Nutritional anemia, including the nutrition or mineral deficits  Acute Blood Loss Anemia  Chronic Blood Loss Anemia, including the suspected or known cause  Anemia of chronic disease, including the associated chronic disease state  Other  Clinically Undetermined  Document any associated diagnoses/conditions.  Supporting Information:  Hgb has dropped from 11 to 7.7  Transfused 1 unit PRBC's   Please exercise your independent, professional judgment when responding. A specific answer is not anticipated or expected.  Thank You,  Shellee MiloEileen T Zayra Devito RN, BSN, CCDS Health Information Management [place] [phone]; Cell: [phone]

## 2015-04-12 NOTE — Anesthesia Preprocedure Evaluation (Signed)
Anesthesia Evaluation  Patient identified by MRN, date of birth, ID band Patient awake    Reviewed: Allergy & Precautions, NPO status , Patient's Chart, lab work & pertinent test results  Airway Mallampati: II  TM Distance: >3 FB Neck ROM: Full    Dental no notable dental hx.    Pulmonary neg pulmonary ROS,    Pulmonary exam normal breath sounds clear to auscultation       Cardiovascular negative cardio ROS Normal cardiovascular exam     Neuro/Psych negative neurological ROS  negative psych ROS   GI/Hepatic negative GI ROS, Neg liver ROS, Hx of melena   Endo/Other  negative endocrine ROS  Renal/GU negative Renal ROS  negative genitourinary   Musculoskeletal  (+) Arthritis , Osteoarthritis,    Abdominal Normal abdominal exam  (+)   Peds negative pediatric ROS (+)  Hematology  (+) anemia ,   Anesthesia Other Findings   Reproductive/Obstetrics                             Anesthesia Physical  Anesthesia Plan  ASA: II and emergent  Anesthesia Plan: General   Post-op Pain Management:    Induction: Intravenous  Airway Management Planned: Nasal Cannula  Additional Equipment:   Intra-op Plan:   Post-operative Plan:   Informed Consent: I have reviewed the patients History and Physical, chart, labs and discussed the procedure including the risks, benefits and alternatives for the proposed anesthesia with the patient or authorized representative who has indicated his/her understanding and acceptance.   Dental advisory given  Plan Discussed with: Surgeon and CRNA  Anesthesia Plan Comments:         Anesthesia Quick Evaluation

## 2015-04-12 NOTE — Consult Note (Signed)
Patient ID: Charles Davila, male   DOB: May 19, 1940, 74 y.o.   MRN: [id number]  Chief Complaint  Patient presents with  . Rectal Bleeding    HPI Location, Quality, Duration, Severity, Timing, Context, Modifying Factors, Associated Signs and Symptoms.  Charles Davila is a 74 y.o. male.  Who had episode of melena on Friday followed by some dizziness. Was brought in found to have a hemoglobin of 7. Upper endoscopy performed yesterday demonstrates some blood seen within the second portion of the duodenum with a duodenal diverticulum. No active bleeding was seen. He has received a total of 2 units of blood. He has remained hemodynamically stable. There has been no further melena. Been no nausea no vomiting and no bloody emesis.  Globin this morning is 7.7. BUN is 27. Admission BUN was 29.  He is scheduled for a repeat upper endoscopy with a side-viewing duodenoscope. Several months prior the patient was seen for back pain was placed on nonsteroidal anti-inflammatories which he states he took these included aspirin and Aleve.   Past Medical History  Diagnosis Date  . Arthritis     Past Surgical History  Procedure Laterality Date  . Appendectomy    . Esophagogastroduodenoscopy N/A 04/11/2015    Procedure: ESOPHAGOGASTRODUODENOSCOPY (EGD);  Surgeon: Manya Silvas, MD;  Location[facility] ENDOSCOPY;  Service: Endoscopy;  Laterality: N/A;    History reviewed. No pertinent family history.  Social History Social History  Substance Use Topics  . Smoking status: Never Smoker   . Smokeless tobacco: None  . Alcohol Use: No     Comment: 1 beer every night    No Known Allergies  Current Facility-Administered Medications  Medication Dose Route Frequency Provider Last Rate Last Dose  . 0.9 %  sodium chloride infusion   Intravenous Continuous Gladstone Lighter, MD 100 mL/hr at 04/12/15 [phone]    . 0.9 %  sodium chloride infusion   Intravenous Once Gladstone Lighter, MD      . acetaminophen  (TYLENOL) tablet 650 mg  650 mg Oral Q6H PRN Gladstone Lighter, MD   650 mg at 04/10/15 1212   Or  . acetaminophen (TYLENOL) suppository 650 mg  650 mg Rectal Q6H PRN Gladstone Lighter, MD      . ondansetron (ZOFRAN) tablet 4 mg  4 mg Oral Q6H PRN Gladstone Lighter, MD       Or  . ondansetron (ZOFRAN) injection 4 mg  4 mg Intravenous Q6H PRN Gladstone Lighter, MD      . oxyCODONE (Oxy IR/ROXICODONE) immediate release tablet 5 mg  5 mg Oral Q4H PRN Gladstone Lighter, MD      . pantoprazole (PROTONIX) 80 mg in sodium chloride 0.9 % 250 mL (0.32 mg/mL) infusion  8 mg/hr Intravenous Continuous Eula Listen, MD 25 mL/hr at 04/12/15 0546 8 mg/hr at 04/12/15 0546  . simethicone (MYLICON) chewable tablet 160 mg  160 mg Oral TID PRN Gladstone Lighter, MD      . sodium chloride 0.9 % injection 3 mL  3 mL Intravenous Q12H Gladstone Lighter, MD   3 mL at 04/09/15 2200  . sucralfate (CARAFATE) tablet 1 g  1 g Oral TID WC & HS Cari Richards, PA-C   1 g at 04/11/15 2242    Blood pressure 145/69, pulse 69, temperature 97.8 F (36.6 C), temperature source Oral, resp. rate 16, height 5' 8"  (1.727 m), weight 160 lb (72.576 kg), SpO2 100 %.  Results for orders placed or performed during the hospital encounter of 04/09/15 (from the  past 48 hour(s))  Hemoglobin     Status: Abnormal   Collection Time: 04/10/15  4:00 PM  Result Value Ref Range   Hemoglobin 7.0 (L) 13.0 - 18.0 g/dL  CBC     Status: Abnormal   Collection Time: 04/10/15 11:57 PM  Result Value Ref Range   WBC 7.4 3.8 - 10.6 K/uL   RBC 2.57 (L) 4.40 - 5.90 MIL/uL   Hemoglobin 7.7 (L) 13.0 - 18.0 g/dL   HCT 22.6 (L) 40.0 - 52.0 %   MCV 87.8 80.0 - 100.0 fL   MCH 29.7 26.0 - 34.0 pg   MCHC 33.9 32.0 - 36.0 g/dL   RDW 14.0 11.5 - 14.5 %   Platelets 276 150 - 440 K/uL  CBC     Status: Abnormal   Collection Time: 04/11/15  5:22 AM  Result Value Ref Range   WBC 6.9 3.8 - 10.6 K/uL   RBC 2.51 (L) 4.40 - 5.90 MIL/uL   Hemoglobin 7.5 (L)  13.0 - 18.0 g/dL   HCT 22.0 (L) 40.0 - 52.0 %   MCV 87.7 80.0 - 100.0 fL   MCH 29.9 26.0 - 34.0 pg   MCHC 34.0 32.0 - 36.0 g/dL   RDW 13.9 11.5 - 14.5 %   Platelets 248 150 - 440 K/uL  Hemoglobin     Status: Abnormal   Collection Time: 04/11/15 10:04 AM  Result Value Ref Range   Hemoglobin 7.2 (L) 13.0 - 18.0 g/dL  Hemoglobin     Status: Abnormal   Collection Time: 04/11/15  3:25 PM  Result Value Ref Range   Hemoglobin 8.2 (L) 13.0 - 18.0 g/dL  Hemoglobin     Status: Abnormal   Collection Time: 04/11/15  9:31 PM  Result Value Ref Range   Hemoglobin 8.1 (L) 13.0 - 18.0 g/dL  CBC     Status: Abnormal   Collection Time: 04/12/15  5:02 AM  Result Value Ref Range   WBC 7.7 3.8 - 10.6 K/uL   RBC 2.47 (L) 4.40 - 5.90 MIL/uL   Hemoglobin 7.7 (L) 13.0 - 18.0 g/dL   HCT 21.9 (L) 40.0 - 52.0 %   MCV 88.9 80.0 - 100.0 fL   MCH 31.3 26.0 - 34.0 pg   MCHC 35.2 32.0 - 36.0 g/dL   RDW 14.0 11.5 - 14.5 %   Platelets 265 150 - 440 K/uL  Basic metabolic panel     Status: Abnormal   Collection Time: 04/12/15  5:02 AM  Result Value Ref Range   Sodium 140 135 - 145 mmol/L   Potassium 3.6 3.5 - 5.1 mmol/L   Chloride 110 101 - 111 mmol/L   CO2 27 22 - 32 mmol/L   Glucose, Bld 91 65 - 99 mg/dL   BUN 19 6 - 20 mg/dL   Creatinine, Ser 0.75 0.61 - 1.24 mg/dL   Calcium 8.0 (L) 8.9 - 10.3 mg/dL   GFR calc non Af Amer >60 >60 mL/min   GFR calc Af Amer >60 >60 mL/min    Comment: (NOTE) The eGFR has been calculated using the CKD EPI equation. This calculation has not been validated in all clinical situations. eGFR's persistently <60 mL/min signify possible Chronic Kidney Disease.    Anion gap 3 (L) 5 - 15    Review of Systems  Constitutional: Negative.   Eyes: Negative.   Respiratory: Negative.   Cardiovascular: Negative.   Gastrointestinal: Positive for blood in stool and melena. Negative for heartburn, nausea, vomiting, abdominal  pain and diarrhea.  Neurological: Negative.    Psychiatric/Behavioral: Negative.     Physical Exam  Constitutional: He is oriented to person, place, and time and well-developed, well-nourished, and in no distress. No distress.  HENT:  Head: Normocephalic and atraumatic.  Eyes: Conjunctivae are normal. Pupils are equal, round, and reactive to light. No scleral icterus.  Neck: Neck supple.  Cardiovascular: Normal rate.   Pulmonary/Chest: Effort normal. No respiratory distress.  Abdominal: Soft. Bowel sounds are normal. He exhibits distension. He exhibits no mass. There is no tenderness. There is no rebound and no guarding.  Neurological: He is oriented to person, place, and time.  Skin: Skin is dry. He is not diaphoretic.  Psychiatric: Mood, memory, affect and judgment normal.    Data Reviewed I personally reviewed the upper endoscopy images from yesterday along with the report.  Assessment    Upper GI bleed. 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  Moderate sized duodenal diverticulum. EGD yesterday demonstrated some blood but no obvious source of bleeding.  I very much doubt that this is related to a malignancy given the history and time course.     Plan    He is to undergo a repeat side-viewing endoscopy today. Based on these findings he may or may not require surgical intervention. Discussed with him and his family extensively the indications for surgery and all her questions were answered.       Sherri Rad MD, FACS 04/12/2015, 10:03 AM

## 2015-04-12 NOTE — Progress Notes (Signed)
[facility] [place] [facility]   PATIENT NAME: Charles Davila    MR#:  [id number]  DATE OF BIRTH:  05/20/1940  SUBJECTIVE:  CHIEF COMPLAINT:   Chief Complaint  Patient presents with  . Rectal Bleeding   -hb still at 7.7, received 2 units blood so far - repeat EGD today with side view scope. Last EGD showing duodenal diverticular bleed - In good spirits today  REVIEW OF SYSTEMS:  Review of Systems  Constitutional: Negative for fever and chills.  HENT: Negative for ear discharge, ear pain and nosebleeds.   Eyes: Negative for blurred vision and double vision.  Respiratory: Negative for cough, shortness of breath and wheezing.   Cardiovascular: Negative for chest pain and palpitations.  Gastrointestinal: Negative for nausea, vomiting, abdominal pain, diarrhea and constipation.  Genitourinary: Negative for dysuria.  Neurological: Negative for dizziness, speech change, focal weakness, seizures, weakness and headaches.  Psychiatric/Behavioral: Negative for depression.    DRUG ALLERGIES:  No Known Allergies  VITALS:  Blood pressure 137/61, pulse 76, temperature 98.1 F (36.7 C), temperature source Oral, resp. rate 14, height 5\' 8"  (1.727 m), weight 72.576 kg (160 lb), SpO2 98 %.  PHYSICAL EXAMINATION:  Physical Exam  GENERAL: 74 y.o.-year-old patient lying in the bed with no acute distress.  EYES: Pupils equal, round, reactive to light and accommodation. No scleral icterus. Extraocular muscles intact.  HEENT: Head atraumatic, normocephalic. Oropharynx and nasopharynx clear.  NECK: Supple, no jugular venous distention. No thyroid enlargement, no tenderness.  LUNGS: Normal breath sounds bilaterally, no wheezing, rales,rhonchi or crepitation. No use of accessory muscles of respiration.  CARDIOVASCULAR: S1, S2 normal. No murmurs, rubs, or gallops.  ABDOMEN: Soft, nontender, nondistended. Bowel sounds present. No organomegaly or mass.   EXTREMITIES: No pedal edema, cyanosis, or clubbing.  NEUROLOGIC: Cranial nerves II through XII are intact. Muscle strength 5/5 in all extremities. Sensation intact. Gait not checked.  PSYCHIATRIC: The patient is alert and oriented x 3.  SKIN: No obvious rash, lesion, or ulcer.   LABORATORY PANEL:   CBC  Recent Labs Lab 04/12/15 0502  WBC 7.7  HGB 7.7*  HCT 21.9*  PLT 265   ------------------------------------------------------------------------------------------------------------------  Chemistries   Recent Labs Lab 04/12/15 0502  NA 140  K 3.6  CL 110  CO2 27  GLUCOSE 91  BUN 19  CREATININE 0.75  CALCIUM 8.0*   ------------------------------------------------------------------------------------------------------------------  Cardiac Enzymes No results for input(s): TROPONINI in the last 168 hours. ------------------------------------------------------------------------------------------------------------------  RADIOLOGY:  No results found.  EKG:  No orders found for this or any previous visit.  ASSESSMENT AND PLAN:   Charles Davila is a 74 y.o. male with a known history of arthritis taking aspirin and Aleve at home comes to the emergency room secondary to an episode of lightheadedness and melena.  #1 Melena- hemoglobin dropped and staying around 7. Received one unit transfusion yesterday, one more unit today. -EGD done showing normal stomach and esophagus, but bleeding duodenal diverticulum. - Repeat EGD today with side view scope -Hb at 7.7 today, received 2 units PRBC so far, 1 more unit today -Blood pressure is stable, so can continue to monitor on the floor at this time. -- cont IV protonix - Appreciate GI consult and surgical consult - stop asa and NSAIDS -simethicone for gas pain  #2 Arthritis- oxycodone prn for now  #3 DVT Prophylaxis- TEDs. SCDs Avoid heparin products for now  #4 Leukocytosis- likely stress reaction. Resolved  now   All the records are  reviewed and case discussed with Care Management/Social Workerr. Management plans discussed with the patient, family and they are in agreement.  CODE STATUS: Full Code  TOTAL TIME TAKING CARE OF THIS PATIENT: 36 minutes.   POSSIBLE D/C IN 1-2 DAYS, DEPENDING ON CLINICAL CONDITION.   Darrion Macaulay M.D on 04/12/2015 at 1:12 PM  Between 7am to 6pm - Pager - [phone]  After 6pm go to [email or web address] - password [email or web address] [facility]  [facility]  Office  [phone]  CC: Primary care physician; Lynnea Ferrier, MD

## 2015-04-13 LAB — TYPE AND SCREEN
ABO/RH(D): A POS
Antibody Screen: NEGATIVE
UNIT DIVISION: 0
Unit division: 0
Unit division: 0
Unit division: 0

## 2015-04-13 LAB — CBC
HCT: 25.6 % — ABNORMAL LOW (ref 40.0–52.0)
HEMOGLOBIN: 8.6 g/dL — AB (ref 13.0–18.0)
MCH: 29.7 pg (ref 26.0–34.0)
MCHC: 33.4 g/dL (ref 32.0–36.0)
MCV: 88.9 fL (ref 80.0–100.0)
Platelets: 307 10*3/uL (ref 150–440)
RBC: 2.88 MIL/uL — ABNORMAL LOW (ref 4.40–5.90)
RDW: 14.6 % — AB (ref 11.5–14.5)
WBC: 7.5 10*3/uL (ref 3.8–10.6)

## 2015-04-13 MED ORDER — PANTOPRAZOLE SODIUM 40 MG PO TBEC
40.0000 mg | DELAYED_RELEASE_TABLET | Freq: Two times a day (BID) | ORAL | Status: DC
  Administered 2015-04-13: 40 mg via ORAL
  Filled 2015-04-13: qty 1

## 2015-04-13 MED ORDER — PANTOPRAZOLE SODIUM 40 MG PO TBEC: 40.0000 mg | DELAYED_RELEASE_TABLET | Freq: Two times a day (BID) | ORAL | Status: DC

## 2015-04-13 NOTE — Progress Notes (Signed)
Alert and oriented. VS stable. No signs of acute distress. tolerating diet.  Discharge instructions given. Patient verbalized understanding. No other issues observed.

## 2015-04-13 NOTE — Anesthesia Postprocedure Evaluation (Signed)
Anesthesia Post Note  Patient: Charles Davila  Procedure(s) Performed: Procedure(s) (LRB): ESOPHAGOGASTRODUODENOSCOPY (EGD) (N/A)  Patient location during evaluation: Endoscopy Anesthesia Type: General Level of consciousness: awake, awake and alert and oriented Pain management: pain level controlled Vital Signs Assessment: post-procedure vital signs reviewed and stable Respiratory status: spontaneous breathing Cardiovascular status: blood pressure returned to baseline Anesthetic complications: no    Last Vitals:  Filed Vitals:   04/12/15 2047 04/13/15 0428  BP: 150/61 135/76  Pulse: 67 65  Temp: 36.6 C 36.9 C  Resp: 18 14    Last Pain:  Filed Vitals:   04/13/15 0429  PainSc: 0-No pain                 Kalonji Zurawski

## 2015-04-13 NOTE — Discharge Summary (Signed)
[facility] - [place] at [facility]   PATIENT NAME: Charles Davila    MR#:  [id number]  DATE OF BIRTH:  [birth year]  DATE OF ADMISSION:  04/09/2015 ADMITTING PHYSICIAN: Enid Baas, MD  DATE OF DISCHARGE: 04/13/15  PRIMARY CARE PHYSICIAN: Curtis Sites III, MD    ADMISSION DIAGNOSIS:  Sinus tachycardia (HCC) [R00.0] Melena [K92.1] Postural lightheadedness [R42]  DISCHARGE DIAGNOSIS:  Active Problems:   Melena   SECONDARY DIAGNOSIS:   Past Medical History  Diagnosis Date  . Arthritis     HOSPITAL COURSE:   Charles Davila is a 74 y.o. male with a known history of arthritis taking aspirin and Aleve at home comes to the emergency room secondary to an episode of lightheadedness and melena.  #1 Melena and upper GI bleed- hemoglobin dropped from 11 to 7 and patient received 3 units of prbc transfusion this admission - Hemoglobin now is 8.6 -EGD done showing normal stomach and esophagus, but blood in the duodenal diverticulum. - Repeat EGD with normal with no bleeding noted anywhere. Patient has some inflammatory polyps in the antrum. The duodenal diverticulum is clean without any blood. -Started on soft diet. He will need hemoglobin follow-up in 1 week with PCP. -GI follow up in 2 weeks. -Will discharge on Protonix twice a day - Appreciate GI consult and surgical consult - AVOID asa and NSAIDS  #2 Arthritis- oxycodone prn for now  #3 Leukocytosis- on admission, stress reaction. Resolved now  DISCHARGE CONDITIONS:   Stable  CONSULTS OBTAINED:  Treatment Team:  Scot Jun, MD Renford Dills, MD  DRUG ALLERGIES:  No Known Allergies  DISCHARGE MEDICATIONS:   Current Discharge Medication List    START taking these medications   Details  pantoprazole (PROTONIX) 40 MG tablet Take 1 tablet (40 mg total) by mouth 2 (two) times daily before a meal. Qty: 60 tablet, Refills: 2      CONTINUE these medications which have  NOT CHANGED   Details  vardenafil (LEVITRA) 20 MG tablet Take 20 mg by mouth as needed for erectile dysfunction.      STOP taking these medications     aspirin EC 325 MG tablet      naproxen sodium (ANAPROX) 220 MG tablet          DISCHARGE INSTRUCTIONS:   1. PCP f/u in 1 week for hemoglobin check 2. GI follow up in 2 weeks   If you experience worsening of your admission symptoms, develop shortness of breath, life threatening emergency, suicidal or homicidal thoughts you must seek medical attention immediately by calling 911 or calling your MD immediately  if symptoms less severe.  You Must read complete instructions/literature along with all the possible adverse reactions/side effects for all the Medicines you take and that have been prescribed to you. Take any new Medicines after you have completely understood and accept all the possible adverse reactions/side effects.   Please note  You were cared for by a hospitalist during your hospital stay. If you have any questions about your discharge medications or the care you received while you were in the hospital after you are discharged, you can call the unit and asked to speak with the hospitalist on call if the hospitalist that took care of you is not available. Once you are discharged, your primary care physician will handle any further medical issues. Please note that NO REFILLS for any discharge medications will be authorized once you are discharged, as it is imperative that  you return to your primary care physician (or establish a relationship with a primary care physician if you do not have one) for your aftercare needs so that they can reassess your need for medications and monitor your lab values.    Today   CHIEF COMPLAINT:   Chief Complaint  Patient presents with  . Rectal Bleeding    VITAL SIGNS:  Blood pressure 135/76, pulse 65, temperature 98.4 F (36.9 C), temperature source Oral, resp. rate 14, height 5\' 8"   (1.727 m), weight 72.576 kg (160 lb), SpO2 99 %.  I/O:   Intake/Output Summary (Last 24 hours) at 04/13/15 0912 Last data filed at 04/13/15 0755  Gross per 24 hour  Intake 3270.64 ml  Output   2500 ml  Net 770.64 ml    PHYSICAL EXAMINATION:   Physical Exam  GENERAL: 74 y.o.-year-old patient lying in the bed with no acute distress.  EYES: Pupils equal, round, reactive to light and accommodation. No scleral icterus. Extraocular muscles intact.  HEENT: Head atraumatic, normocephalic. Oropharynx and nasopharynx clear.  NECK: Supple, no jugular venous distention. No thyroid enlargement, no tenderness.  LUNGS: Normal breath sounds bilaterally, no wheezing, rales,rhonchi or crepitation. No use of accessory muscles of respiration.  CARDIOVASCULAR: S1, S2 normal. No murmurs, rubs, or gallops.  ABDOMEN: Soft, nontender, nondistended. Bowel sounds present. No organomegaly or mass.  EXTREMITIES: No pedal edema, cyanosis, or clubbing.  NEUROLOGIC: Cranial nerves II through XII are intact. Muscle strength 5/5 in all extremities. Sensation intact. Gait not checked.  PSYCHIATRIC: The patient is alert and oriented x 3.  SKIN: No obvious rash, lesion, or ulcer.   DATA REVIEW:   CBC  Recent Labs Lab 04/13/15 0451  WBC 7.5  HGB 8.6*  HCT 25.6*  PLT 307    Chemistries   Recent Labs Lab 04/12/15 0502  NA 140  K 3.6  CL 110  CO2 27  GLUCOSE 91  BUN 19  CREATININE 0.75  CALCIUM 8.0*    Cardiac Enzymes No results for input(s): TROPONINI in the last 168 hours.  Microbiology Results  No results found for this or any previous visit.  RADIOLOGY:  No results found.  EKG:  No orders found for this or any previous visit.    Management plans discussed with the patient, family and they are in agreement.  CODE STATUS:     Code Status Orders        Start     Ordered   04/09/15 1620  Full code   Continuous     04/09/15 1619    Advance Directive Documentation         Most Recent Value   Type of Advance Directive  Healthcare Power of Attorney, Living will   Pre-existing out of facility DNR order (yellow form or pink MOST form)     "MOST" Form in Place?        TOTAL TIME TAKING CARE OF THIS PATIENT: 38  minutes.    [name],Ell Tiso M.D on 04/13/2015 at 9:12 AM  Between 7am to 6pm - Pager - [phone]  After 6pm go to [email or web address] - password [email or web address] [facility]  [facility]  Office  [phone]  CC: Primary care physician; Lynnea FerrierBERT J KLEIN III, MD

## 2015-04-15 ENCOUNTER — Encounter: Payer: Self-pay | Admitting: Gastroenterology

## 2017-07-12 ENCOUNTER — Other Ambulatory Visit: Payer: Self-pay | Admitting: Internal Medicine

## 2017-07-12 DIAGNOSIS — H532 Diplopia: Secondary | ICD-10-CM

## 2017-07-17 ENCOUNTER — Ambulatory Visit
Admission: RE | Admit: 2017-07-17 | Discharge: 2017-07-17 | Disposition: A | Discharge status: Home / Self Care | Payer: Medicare Other | Source: Ambulatory Visit | Attending: Internal Medicine | Admitting: Internal Medicine

## 2017-07-17 DIAGNOSIS — H748X1 Other specified disorders of right middle ear and mastoid: Secondary | ICD-10-CM | POA: Diagnosis not present

## 2017-07-17 DIAGNOSIS — H532 Diplopia: Secondary | ICD-10-CM | POA: Insufficient documentation

## 2017-07-17 LAB — POCT I-STAT CREATININE: Creatinine, Ser: 0.8 mg/dL (ref 0.61–1.24)

## 2017-07-17 MED ORDER — GADOBENATE DIMEGLUMINE 529 MG/ML IV SOLN
15.0000 mL | Freq: Once | INTRAVENOUS | Status: AC | PRN
  Administered 2017-07-17: 15 mL via INTRAVENOUS

## 2017-07-19 ENCOUNTER — Ambulatory Visit: Payer: Medicare Other

## 2020-02-25 ENCOUNTER — Other Ambulatory Visit
Admission: RE | Admit: 2020-02-25 | Discharge: 2020-02-25 | Disposition: A | Discharge status: Home / Self Care | Payer: Medicare Other | Source: Ambulatory Visit | Attending: Internal Medicine | Admitting: Internal Medicine

## 2020-02-25 DIAGNOSIS — R0789 Other chest pain: Secondary | ICD-10-CM | POA: Insufficient documentation

## 2020-02-25 DIAGNOSIS — R0600 Dyspnea, unspecified: Secondary | ICD-10-CM | POA: Diagnosis present

## 2020-02-25 LAB — TROPONIN I (HIGH SENSITIVITY): Troponin I (High Sensitivity): 9 ng/L (ref <18)

## 2020-02-26 ENCOUNTER — Other Ambulatory Visit: Payer: Self-pay | Admitting: Internal Medicine

## 2020-02-26 ENCOUNTER — Other Ambulatory Visit: Payer: Self-pay

## 2020-02-26 ENCOUNTER — Ambulatory Visit
Admission: RE | Admit: 2020-02-26 | Discharge: 2020-02-26 | Disposition: A | Discharge status: Home / Self Care | Payer: Medicare Other | Source: Ambulatory Visit | Attending: Internal Medicine | Admitting: Internal Medicine

## 2020-02-26 ENCOUNTER — Other Ambulatory Visit
Admission: RE | Admit: 2020-02-26 | Discharge: 2020-02-26 | Disposition: A | Discharge status: Home / Self Care | Payer: Medicare Other | Source: Home / Self Care | Attending: Internal Medicine | Admitting: Internal Medicine

## 2020-02-26 DIAGNOSIS — R0600 Dyspnea, unspecified: Secondary | ICD-10-CM | POA: Insufficient documentation

## 2020-02-26 DIAGNOSIS — R0609 Other forms of dyspnea: Secondary | ICD-10-CM

## 2020-02-26 DIAGNOSIS — R7989 Other specified abnormal findings of blood chemistry: Secondary | ICD-10-CM | POA: Insufficient documentation

## 2020-02-26 LAB — COMPREHENSIVE METABOLIC PANEL
ALT: 16 U/L (ref 0–44)
AST: 23 U/L (ref 15–41)
Albumin: 3.9 g/dL (ref 3.5–5.0)
Alkaline Phosphatase: 80 U/L (ref 38–126)
Anion gap: 10 (ref 5–15)
BUN: 14 mg/dL (ref 8–23)
CO2: 26 mmol/L (ref 22–32)
Calcium: 8.9 mg/dL (ref 8.9–10.3)
Chloride: 99 mmol/L (ref 98–111)
Creatinine, Ser: 0.82 mg/dL (ref 0.61–1.24)
GFR, Estimated: >60 mL/min (ref >60)
Glucose, Bld: 132 mg/dL — ABNORMAL HIGH (ref 70–99)
Potassium: 3.8 mmol/L (ref 3.5–5.1)
Sodium: 135 mmol/L (ref 135–145)
Total Bilirubin: 0.7 mg/dL (ref 0.3–1.2)
Total Protein: 7.2 g/dL (ref 6.5–8.1)

## 2020-02-26 MED ORDER — IOHEXOL 350 MG/ML SOLN
100.0000 mL | Freq: Once | INTRAVENOUS | Status: AC | PRN
  Administered 2020-02-26: 100 mL via INTRAVENOUS

## 2020-02-26 MED ORDER — IOHEXOL 300 MG/ML  SOLN: 100.0000 mL | Freq: Once | INTRAMUSCULAR | Status: DC | PRN

## 2020-05-17 ENCOUNTER — Other Ambulatory Visit: Payer: Self-pay

## 2020-05-17 ENCOUNTER — Encounter: Payer: Self-pay | Admitting: *Deleted

## 2020-05-17 ENCOUNTER — Encounter: Payer: Medicare Other | Attending: Internal Medicine | Admitting: *Deleted

## 2020-05-17 DIAGNOSIS — R0600 Dyspnea, unspecified: Secondary | ICD-10-CM | POA: Insufficient documentation

## 2020-05-17 NOTE — Progress Notes (Signed)
Virtual orientation call completed today. he has an appointment on 05/20/2020 for EP eval and gym Orientation.  Documentation of diagnosis can be found in [facility] 11/3 and [id number]

## 2020-05-20 ENCOUNTER — Encounter: Payer: Medicare Other | Admitting: *Deleted

## 2020-05-20 ENCOUNTER — Other Ambulatory Visit: Payer: Self-pay

## 2020-05-20 VITALS — Ht 66.1 in | Wt 155.9 lb

## 2020-05-20 DIAGNOSIS — R0600 Dyspnea, unspecified: Secondary | ICD-10-CM | POA: Diagnosis present

## 2020-05-20 DIAGNOSIS — U071 COVID-19: Secondary | ICD-10-CM

## 2020-05-20 NOTE — Progress Notes (Signed)
Pulmonary Individual Treatment Plan  Patient Details  Name: Charles Davila MRN: [id number] Date of Birth: 01-22-1941 Referring Provider:   Flowsheet Row Pulmonary Rehab from 05/20/2020 in [facility] Card[facility] and Pulmonary Rehab  Referring Provider Daniel Nones MD  [Pulmonologist: Dr. Rudi Heap Aleskerov]      Initial Encounter Date:  Flowsheet Row Pulmonary Rehab from 05/20/2020 in [facility] Cardiac and Pulmonary Rehab  Date 05/20/20      Visit Diagnosis: Dyspnea due to COVID-19  Patient's Home Medications on Admission:  Current Outpatient Medications:  .  acetaminophen (TYLENOL) 650 MG CR tablet, Take 650 mg by mouth every 6 (six) hours as needed., Disp: , Rfl:  .  albuterol (VENTOLIN HFA) 108 (90 Base) MCG/ACT inhaler, Inhale into the lungs., Disp: , Rfl:  .  pantoprazole (PROTONIX) 40 MG tablet, Take 1 tablet by mouth daily., Disp: , Rfl:  .  vardenafil (LEVITRA) 20 MG tablet, Take 20 mg by mouth as needed for erectile dysfunction. (Patient not taking: Reported on 05/17/2020), Disp: , Rfl:   Past Medical History: Past Medical History:  Diagnosis Date  . Arthritis     Tobacco Use: Social History   Tobacco Use  Smoking Status Former Smoker  . Packs/day: 2.00  . Years: 10.00  . Pack years: 20.00  . Types: Cigarettes  . Quit date: 04/24/1970  . Years since quitting: 50.1  Smokeless Tobacco Never Used    Labs: Recent Review Flowsheet Data   There is no flowsheet data to display.      Pulmonary Assessment Scores:  Pulmonary Assessment Scores    Row Name 05/20/20 1218         ADL UCSD   ADL Phase Entry     SOB Score total 67     Rest 1     Walk 3     Stairs 4     Bath 3     Dress 3     Shop 3           CAT Score   CAT Score 22           mMRC Score   mMRC Score 3            UCSD: Self-administered rating of dyspnea associated with activities of daily living (ADLs) 6-point scale (0 = "not at all" to 5 = "maximal or unable to do because of breathlessness")   Scoring Scores range from 0 to 120.  Minimally important difference is 5 units  CAT: CAT can identify the health impairment of COPD patients and is better correlated with disease progression.  CAT has a scoring range of zero to 40. The CAT score is classified into four groups of low (less than 10), medium (10 - 20), high (21-30) and very high (31-40) based on the impact level of disease on health status. A CAT score over 10 suggests significant symptoms.  A worsening CAT score could be explained by an exacerbation, poor medication adherence, poor inhaler technique, or progression of COPD or comorbid conditions.  CAT MCID is 2 points  mMRC: mMRC (Modified Medical Research Council) Dyspnea Scale is used to assess the degree of baseline functional disability in patients of respiratory disease due to dyspnea. No minimal important difference is established. A decrease in score of 1 point or greater is considered a positive change.   Pulmonary Function Assessment:  Pulmonary Function Assessment - 05/20/20 1218      Breath   Shortness of Breath Yes;Panic with Shortness of Breath;Fear of Shortness  of Breath;Limiting activity           Exercise Target Goals: Exercise Program Goal: Individual exercise prescription set using results from initial 6 min walk test and THRR while considering  patient's activity barriers and safety.   Exercise Prescription Goal: Initial exercise prescription builds to 30-45 minutes a day of aerobic activity, 2-3 days per week.  Home exercise guidelines will be given to patient during program as part of exercise prescription that the participant will acknowledge.  Education: Aerobic Exercise: - Group verbal and visual presentation on the components of exercise prescription. Introduces F.I.T.T principle from ACSM for exercise prescriptions.  Reviews F.I.T.T. principles of aerobic exercise including progression. Written material given at graduation.   Education:  Resistance Exercise: - Group verbal and visual presentation on the components of exercise prescription. Introduces F.I.T.T principle from ACSM for exercise prescriptions  Reviews F.I.T.T. principles of resistance exercise including progression. Written material given at graduation.    Education: Exercise & Equipment Safety: - Individual verbal instruction and demonstration of equipment use and safety with use of the equipment. Flowsheet Row Pulmonary Rehab from 05/20/2020 in [facility] Cardiac and Pulmonary Rehab  Date 05/20/20  Educator [facility]  Instruction Review Code 1- Verbalizes Understanding      Education: Exercise Physiology & General Exercise Guidelines: - Group verbal and written instruction with models to review the exercise physiology of the cardiovascular system and associated critical values. Provides general exercise guidelines with specific guidelines to those with heart or lung disease.    Education: Flexibility, Balance, Mind/Body Relaxation: - Group verbal and visual presentation with interactive activity on the components of exercise prescription. Introduces F.I.T.T principle from ACSM for exercise prescriptions. Reviews F.I.T.T. principles of flexibility and balance exercise training including progression. Also discusses the mind body connection.  Reviews various relaxation techniques to help reduce and manage stress (i.e. Deep breathing, progressive muscle relaxation, and visualization). Balance handout provided to take home. Written material given at graduation.   Activity Barriers & Risk Stratification:  Activity Barriers & Cardiac Risk Stratification - 05/20/20 1211      Activity Barriers & Cardiac Risk Stratification   Activity Barriers Shortness of Breath;Joint Problems;Neck/Spine Problems;Other (comment);Arthritis;Deconditioning;Muscular Weakness    Comments arthritis in shoulders, back, and neck; limited ROM in R shoulder (chronic)           6 Minute Walk:  6 Minute  Walk    Row Name 05/20/20 1202         6 Minute Walk   Phase Initial     Distance 1195 feet     Walk Time 5.5 minutes     # of Rest Breaks 0  test stopped due to desaturation     MPH 2.46     METS 2.97     RPE 13     Perceived Dyspnea  3     VO2 Peak 10.41     Symptoms Yes (comment)     Comments SOB     Resting HR 92 bpm     Resting BP 146/84     Resting Oxygen Saturation  94 %     Exercise Oxygen Saturation  during 6 min walk 78 %     Max Ex. HR 117 bpm     Max Ex. BP 172/74     2 Minute Post BP 156/74           Interval HR   1 Minute HR 104     2 Minute HR 108  3 Minute HR 108     4 Minute HR 112     5 Minute HR 110     6 Minute HR 117     2 Minute Post HR 94     Interval Heart Rate? Yes           Interval Oxygen   Interval Oxygen? Yes     Baseline Oxygen Saturation % 94 %     1 Minute Oxygen Saturation % 90 %     1 Minute Liters of Oxygen 0 L  Room Air     2 Minute Oxygen Saturation % 86 %     2 Minute Liters of Oxygen 0 L     3 Minute Oxygen Saturation % 85 %     3 Minute Liters of Oxygen 0 L     4 Minute Oxygen Saturation % 81 %     4 Minute Liters of Oxygen 0 L     5 Minute Oxygen Saturation % 81 %     5 Minute Liters of Oxygen 0 L     6 Minute Oxygen Saturation % 79 %  test stopped at 5:30 lowest drop to 78% at 6:22     6 Minute Liters of Oxygen 0 L     2 Minute Post Oxygen Saturation % 81 %  recheck at 9:42 93%     2 Minute Post Liters of Oxygen 0 L           Oxygen Initial Assessment:  Oxygen Initial Assessment - 05/20/20 1218      Home Oxygen   Home Oxygen Device None    Sleep Oxygen Prescription None    Home Exercise Oxygen Prescription None    Home Resting Oxygen Prescription None      Initial 6 min Walk   Oxygen Used None      Program Oxygen Prescription   Program Oxygen Prescription None      Intervention   Short Term Goals To learn and understand importance of maintaining oxygen saturations>88%;To learn and demonstrate proper  pursed lip breathing techniques or other breathing techniques.;To learn and demonstrate proper use of respiratory medications;To learn and understand importance of monitoring SPO2 with pulse oximeter and demonstrate accurate use of the pulse oximeter.    Long  Term Goals Maintenance of O2 saturations>88%;Exhibits proper breathing techniques, such as pursed lip breathing or other method taught during program session;Demonstrates proper use of MDI's;Compliance with respiratory medication;Verbalizes importance of monitoring SPO2 with pulse oximeter and return demonstration           Oxygen Re-Evaluation:   Oxygen Discharge (Final Oxygen Re-Evaluation):   Initial Exercise Prescription:  Initial Exercise Prescription - 05/20/20 1200      Date of Initial Exercise RX and Referring Provider   Date 05/20/20    Referring Provider Daniel Nones MD   Pulmonologist: Dr. Amanda Cockayne     Treadmill   MPH 2    Grade 0.5    Minutes 15    METs 2.67      Recumbant Bike   Level 2    RPM 50    Watts 22    Minutes 15    METs 2      NuStep   Level 2    SPM 80    Minutes 15    METs 2      Prescription Details   Frequency (times per week) 2    Duration Progress to 30 minutes of continuous aerobic  without signs/symptoms of physical distress      Intensity   THRR 40-80% of Max Heartrate 112-133    Ratings of Perceived Exertion 11-13    Perceived Dyspnea 0-4      Progression   Progression Continue to progress workloads to maintain intensity without signs/symptoms of physical distress.      Resistance Training   Training Prescription Yes    Weight 3 lb    Reps 10-15           Perform Capillary Blood Glucose checks as needed.  Exercise Prescription Changes:  Exercise Prescription Changes    Row Name 05/20/20 1200             Response to Exercise   Blood Pressure (Admit) 146/84       Blood Pressure (Exercise) 172/74       Blood Pressure (Exit) 152/62       Heart Rate  (Admit) 92 bpm       Heart Rate (Exercise) 117 bpm       Heart Rate (Exit) 95 bpm       Oxygen Saturation (Admit) 94 %       Oxygen Saturation (Exercise) 78 %       Oxygen Saturation (Exit) 90 %       Rating of Perceived Exertion (Exercise) 13       Perceived Dyspnea (Exercise) 3       Symptoms SOB       Comments walk test results              Exercise Comments:   Exercise Goals and Review:  Exercise Goals    Row Name 05/20/20 1213             Exercise Goals   Increase Physical Activity Yes       Intervention Provide advice, education, support and counseling about physical activity/exercise needs.;Develop an individualized exercise prescription for aerobic and resistive training based on initial evaluation findings, risk stratification, comorbidities and participant's personal goals.       Expected Outcomes Short Term: Attend rehab on a regular basis to increase amount of physical activity.;Long Term: Add in home exercise to make exercise part of routine and to increase amount of physical activity.;Long Term: Exercising regularly at least 3-5 days a week.       Increase Strength and Stamina Yes       Intervention Provide advice, education, support and counseling about physical activity/exercise needs.;Develop an individualized exercise prescription for aerobic and resistive training based on initial evaluation findings, risk stratification, comorbidities and participant's personal goals.       Expected Outcomes Short Term: Increase workloads from initial exercise prescription for resistance, speed, and METs.;Short Term: Perform resistance training exercises routinely during rehab and add in resistance training at home;Long Term: Improve cardiorespiratory fitness, muscular endurance and strength as measured by increased METs and functional capacity ( )       Able to understand and use rate of perceived exertion (RPE) scale Yes       Intervention Provide education and explanation  on how to use RPE scale       Expected Outcomes Short Term: Able to use RPE daily in rehab to express subjective intensity level;Long Term:  Able to use RPE to guide intensity level when exercising independently       Able to understand and use Dyspnea scale Yes       Intervention Provide education and explanation on how to use Dyspnea scale  Expected Outcomes Short Term: Able to use Dyspnea scale daily in rehab to express subjective sense of shortness of breath during exertion;Long Term: Able to use Dyspnea scale to guide intensity level when exercising independently       Knowledge and understanding of Target Heart Rate Range (THRR) Yes       Intervention Provide education and explanation of THRR including how the numbers were predicted and where they are located for reference       Expected Outcomes Short Term: Able to state/look up THRR;Short Term: Able to use daily as guideline for intensity in rehab;Long Term: Able to use THRR to govern intensity when exercising independently       Able to check pulse independently Yes       Intervention Provide education and demonstration on how to check pulse in carotid and radial arteries.;Review the importance of being able to check your own pulse for safety during independent exercise       Expected Outcomes Short Term: Able to explain why pulse checking is important during independent exercise;Long Term: Able to check pulse independently and accurately       Understanding of Exercise Prescription Yes       Intervention Provide education, explanation, and written materials on patient's individual exercise prescription       Expected Outcomes Short Term: Able to explain program exercise prescription;Long Term: Able to explain home exercise prescription to exercise independently              Exercise Goals Re-Evaluation :   Discharge Exercise Prescription (Final Exercise Prescription Changes):  Exercise Prescription Changes - 05/20/20 1200       Response to Exercise   Blood Pressure (Admit) 146/84    Blood Pressure (Exercise) 172/74    Blood Pressure (Exit) 152/62    Heart Rate (Admit) 92 bpm    Heart Rate (Exercise) 117 bpm    Heart Rate (Exit) 95 bpm    Oxygen Saturation (Admit) 94 %    Oxygen Saturation (Exercise) 78 %    Oxygen Saturation (Exit) 90 %    Rating of Perceived Exertion (Exercise) 13    Perceived Dyspnea (Exercise) 3    Symptoms SOB    Comments walk test results           Nutrition:  Target Goals: Understanding of nutrition guidelines, daily intake of sodium 1500mg , cholesterol 200mg , calories 30% from fat and 7% or less from saturated fats, daily to have 5 or more servings of fruits and vegetables.  Education: All About Nutrition: -Group instruction provided by verbal, written material, interactive activities, discussions, models, and posters to present general guidelines for heart healthy nutrition including fat, fiber, MyPlate, the role of sodium in heart healthy nutrition, utilization of the nutrition label, and utilization of this knowledge for meal planning. Follow up email sent as well. Written material given at graduation.   Biometrics:  Pre Biometrics - 05/20/20 1214      Pre Biometrics   Height 5' 6.1" (1.679 m)    Weight 155 lb 14.4 oz (70.7 kg)    BMI (Calculated) 25.09    Single Leg Stand 30 seconds            Nutrition Therapy Plan and Nutrition Goals:   Nutrition Assessments:  MEDIFICTS Score Key:  ?70 Need to make dietary changes   40-70 Heart Healthy Diet  ? 40 Therapeutic Level Cholesterol Diet  Flowsheet Row Pulmonary Rehab from 05/20/2020 in [facility] Cardiac and Pulmonary Rehab  Picture  Your Plate Total Score on Admission 57     Picture Your Plate Scores:  <16 Unhealthy dietary pattern with much room for improvement.  41-50 Dietary pattern unlikely to meet recommendations for good health and room for improvement.  51-60 More healthful dietary pattern, with some  room for improvement.   >60 Healthy dietary pattern, although there may be some specific behaviors that could be improved.   Nutrition Goals Re-Evaluation:   Nutrition Goals Discharge (Final Nutrition Goals Re-Evaluation):   Psychosocial: Target Goals: Acknowledge presence or absence of significant depression and/or stress, maximize coping skills, provide positive support system. Participant is able to verbalize types and ability to use techniques and skills needed for reducing stress and depression.   Education: Stress, Anxiety, and Depression - Group verbal and visual presentation to define topics covered.  Reviews how body is impacted by stress, anxiety, and depression.  Also discusses healthy ways to reduce stress and to treat/manage anxiety and depression.  Written material given at graduation. Flowsheet Row Pulmonary Rehab from 05/20/2020 in [facility] Cardiac and Pulmonary Rehab  Education need identified 05/20/20      Education: Sleep Hygiene -Provides group verbal and written instruction about how sleep can affect your health.  Define sleep hygiene, discuss sleep cycles and impact of sleep habits. Review good sleep hygiene tips.    Initial Review & Psychosocial Screening:  Initial Psych Review & Screening - 05/17/20 1119      Initial Review   Current issues with Current Stress Concerns    Source of Stress Concerns Unable to perform yard/household activities      Family Dynamics   Good Support System? Yes   wife, large family- son & daughter their children, friends     Barriers   Psychosocial barriers to participate in program There are no identifiable barriers or psychosocial needs.      Screening Interventions   Interventions Encouraged to exercise    Expected Outcomes Short Term goal: Utilizing psychosocial counselor, staff and physician to assist with identification of specific Stressors or current issues interfering with healing process. Setting desired goal for each  stressor or current issue identified.;Long Term Goal: Stressors or current issues are controlled or eliminated.;Short Term goal: Identification and review with participant of any Quality of Life or Depression concerns found by scoring the questionnaire.;Long Term goal: The participant improves quality of Life and PHQ9 Scores as seen by post scores and/or verbalization of changes           Quality of Life Scores:  Scores of 19 and below usually indicate a poorer quality of life in these areas.  A difference of  2-3 points is a clinically meaningful difference.  A difference of 2-3 points in the total score of the Quality of Life Index has been associated with significant improvement in overall quality of life, self-image, physical symptoms, and general health in studies assessing change in quality of life.  PHQ-9: Recent Review Flowsheet Data    Depression screen [facility] 2/9 05/20/2020   Decreased Interest 1   Down, Depressed, Hopeless 1   PHQ - 2 Score 2   Altered sleeping 2   Tired, decreased energy 3   Change in appetite 0   Feeling bad or failure about yourself  3   Trouble concentrating 1   Moving slowly or fidgety/restless 2   Suicidal thoughts 1   PHQ-9 Score 14   Difficult doing work/chores Somewhat difficult     Interpretation of Total Score  Total Score Depression  Severity:  1-4 = Minimal depression, 5-9 = Mild depression, 10-14 = Moderate depression, 15-19 = Moderately severe depression, 20-27 = Severe depression   Psychosocial Evaluation and Intervention:  Psychosocial Evaluation - 05/17/20 1142      Psychosocial Evaluation & Interventions   Interventions Encouraged to exercise with the program and follow exercise prescription    Comments Ham [place] He has a great support system with his wife, son and daughter and their families. He also has friends around that he feels would help if needed. HIs goal is to improve his  shortness of breath, it is keeping him from his daily activites. His wife has been heklping with the chores that Ham cannot complete. She has had to help with the yardwork. He can ride lawnmower, and she helps with the trimming. He wants to be able to manage his chores without shortness of breath. He is hoping this program will assist him in making the goal. He should do well in the program, he has no barriers to attending.    Expected Outcomes STG: Ham attends all scheduled sessions, his Shortness of breath improves enough to allow him to manage his chores.  LTG: Ham continues to utilize the resources and tools he received during the program and he continues to see improvement with his shortness of breath    Continue Psychosocial Services  Follow up required by staff           Psychosocial Re-Evaluation:   Psychosocial Discharge (Final Psychosocial Re-Evaluation):   Education: Education Goals: Education classes will be provided on a weekly basis, covering required topics. Participant will state understanding/return demonstration of topics presented.  Learning Barriers/Preferences:  Learning Barriers/Preferences - 05/17/20 1121      Learning Barriers/Preferences   Learning Barriers None    Learning Preferences None           General Pulmonary Education Topics:  Infection Prevention: - Provides verbal and written material to individual with discussion of infection control including proper hand washing and proper equipment cleaning during exercise session. Flow[facility]  Date 05/20/20  [facility]  Instruction Review Code 1- Verbalizes Understanding      Falls Prevention: - Provides verbal and written material to individual with discussion of falls prevention and safety. Flowsheet [facility]  Date 05/17/20  Educator SB  Instruction Review Code 1-  Verbalizes Understanding      Chronic Lung Disease Review: - Group verbal instruction with posters, models, PowerPoint presentations and videos,  to review new updates, new respiratory medications, new advancements in procedures and treatments. Providing information on websites and "800" numbers for continued self-education. Includes information about supplement oxygen, available portable oxygen systems, continuous and intermittent flow rates, oxygen safety, concentrators, and Medicare reimbursement for oxygen. Explanation of Pulmonary Drugs, including class, frequency, complications, importance of spacers, rinsing mouth after steroid MDI's, and proper cleaning methods for nebulizers. Review of basic lung anatomy and physiology related to function, structure, and complications of lung disease. Review of risk factors. Discussion about methods for diagnosing sleep apnea and types of masks and machines for OSA. Includes a review of the use of types of environmental controls: home humidity, furnaces, filters, dust mite/pet prevention, HEPA vacuums. Discussion about weather changes, air quality and the benefits of nasal washing. Instruction on Warning signs, infection symptoms, calling MD promptly, preventive modes,  and value of vaccinations. Review of effective airway clearance, coughing and/or vibration techniques. Emphasizing that all should Create an Action Plan. Written material given at graduation. Flowsheet Row Pulmonary Rehab from 05/20/2020 in [facility] Cardiac and Pulmonary Rehab  Education need identified 05/20/20      AED/CPR: - Group verbal and written instruction with the use of models to demonstrate the basic use of the AED with the basic ABC's of resuscitation.    Anatomy and Cardiac Procedures: - Group verbal and visual presentation and models provide information about basic cardiac anatomy and function. Reviews the testing methods done to diagnose heart disease and the outcomes of the test  results. Describes the treatment choices: Medical Management, Angioplasty, or Coronary Bypass Surgery for treating various heart conditions including Myocardial Infarction, Angina, Valve Disease, and Cardiac Arrhythmias.  Written material given at graduation.   Medication Safety: - Group verbal and visual instruction to review commonly prescribed medications for heart and lung disease. Reviews the medication, class of the drug, and side effects. Includes the steps to properly store meds and maintain the prescription regimen.  Written material given at graduation.   Other: -Provides group and verbal instruction on various topics (see comments)   Knowledge Questionnaire Score:  Knowledge Questionnaire Score - 05/20/20 1215      Knowledge Questionnaire Score   Pre Score 15/18 Education Focus: O2 safety            Core Components/Risk Factors/Patient Goals at Admission:  Personal Goals and Risk Factors at Admission - 05/20/20 1215      Core Components/Risk Factors/Patient Goals on Admission    Weight Management Yes;Weight Maintenance    Intervention Weight Management: Develop a combined nutrition and exercise program designed to reach desired caloric intake, while maintaining appropriate intake of nutrient and fiber, sodium and fats, and appropriate energy expenditure required for the weight goal.;Weight Management: Provide education and appropriate resources to help participant work on and attain dietary goals.    Admit Weight 155 lb 14.4 oz (70.7 kg)    Goal Weight: Short Term 152 lb (68.9 kg)    Goal Weight: Long Term 152 lb (68.9 kg)    Expected Outcomes Short Term: Continue to assess and modify interventions until short term weight is achieved;Long Term: Adherence to nutrition and physical activity/exercise program aimed toward attainment of established weight goal;Weight Maintenance: Understanding of the daily nutrition guidelines, which includes 25-35% calories from fat, 7% or less  cal from saturated fats, less than 200mg  cholesterol, less than 1.5gm of sodium, & 5 or more servings of fruits and vegetables daily    Hypertension Yes    Intervention Provide education on lifestyle modifcations including regular physical activity/exercise, weight management, moderate sodium restriction and increased consumption of fresh fruit, vegetables, and low fat dairy, alcohol moderation, and smoking cessation.;Monitor prescription use compliance.    Expected Outcomes Short Term: Continued assessment and intervention until BP is < 140/72mm HG in hypertensive participants. < 130/53mm HG in hypertensive participants with diabetes, heart failure or chronic kidney disease.;Long Term: Maintenance of blood pressure at goal levels.    Lipids Yes    Intervention Provide education and support for participant on nutrition & aerobic/resistive exercise along with prescribed medications to achieve LDL 70mg , HDL >40mg .    Expected Outcomes Short Term: Participant states understanding of desired cholesterol values and is compliant with medications prescribed. Participant is following exercise prescription and nutrition guidelines.;Long Term: Cholesterol controlled with medications as prescribed, with individualized exercise RX and with personalized nutrition plan.  Value goals: LDL < 70mg , HDL > 40 mg.           Education:Diabetes - Individual verbal and written instruction to review signs/symptoms of diabetes, desired ranges of glucose level fasting, after meals and with exercise. Acknowledge that pre and post exercise glucose checks will be done for 3 sessions at entry of program.   Know Your Numbers and Heart Failure: - Group verbal and visual instruction to discuss disease risk factors for cardiac and pulmonary disease and treatment options.  Reviews associated critical values for Overweight/Obesity, Hypertension, Cholesterol, and Diabetes.  Discusses basics of heart failure: signs/symptoms and  treatments.  Introduces Heart Failure Zone chart for action plan for heart failure.  Written material given at graduation.   Core Components/Risk Factors/Patient Goals Review:    Core Components/Risk Factors/Patient Goals at Discharge (Final Review):    ITP Comments:  ITP Comments    Row Name 05/17/20 1141 05/20/20 1201         ITP Comments Virtual orientation call completed today. he has an appointment on 05/20/2020 for EP eval and gym Orientation.  Documentation of diagnosis can be found in [facility] 11/3 and [id number] Completed 03/1601 and gym orientation. Initial ITP created and sent for review to Dr. , Medical Director.             Comments: Initial ITP

## 2020-05-20 NOTE — Patient Instructions (Signed)
Patient Instructions  Patient Details  Name: Charles Davila MRN: [id number] Date of Birth: 02/20/1941 Referring Provider:  Lynnea Ferrier, MD  Below are your personal goals for exercise, nutrition, and risk factors. Our goal is to help you stay on track towards obtaining and maintaining these goals. We will be discussing your progress on these goals with you throughout the program.  Initial Exercise Prescription:  Initial Exercise Prescription - 05/20/20 1200      Date of Initial Exercise RX and Referring Provider   Date 05/20/20    Referring Provider Daniel Nones MD   Pulmonologist: Dr. Amanda Cockayne     Treadmill   MPH 2    Grade 0.5    Minutes 15    METs 2.67      Recumbant Bike   Level 2    RPM 50    Watts 22    Minutes 15    METs 2      NuStep   Level 2    SPM 80    Minutes 15    METs 2      Prescription Details   Frequency (times per week) 2    Duration Progress to 30 minutes of continuous aerobic without signs/symptoms of physical distress      Intensity   THRR 40-80% of Max Heartrate 112-133    Ratings of Perceived Exertion 11-13    Perceived Dyspnea 0-4      Progression   Progression Continue to progress workloads to maintain intensity without signs/symptoms of physical distress.      Resistance Training   Training Prescription Yes    Weight 3 lb    Reps 10-15           Exercise Goals: Frequency: Be able to perform aerobic exercise two to three times per week in program working toward 2-5 days per week of home exercise.  Intensity: Work with a perceived exertion of 11 (fairly light) - 15 (hard) while following your exercise prescription.  We will make changes to your prescription with you as you progress through the program.   Duration: Be able to do 30 to 45 minutes of continuous aerobic exercise in addition to a 5 minute warm-up and a 5 minute cool-down routine.   Nutrition Goals: Your personal nutrition goals will be established when  you do your nutrition analysis with the dietician.  The following are general nutrition guidelines to follow: Cholesterol < 200mg /day Sodium < 1500mg /day Fiber: Men over 50 yrs - 30 grams per day  Personal Goals:  Personal Goals and Risk Factors at Admission - 05/20/20 1215      Core Components/Risk Factors/Patient Goals on Admission    Weight Management Yes;Weight Maintenance    Intervention Weight Management: Develop a combined nutrition and exercise program designed to reach desired caloric intake, while maintaining appropriate intake of nutrient and fiber, sodium and fats, and appropriate energy expenditure required for the weight goal.;Weight Management: Provide education and appropriate resources to help participant work on and attain dietary goals.    Admit Weight 155 lb 14.4 oz (70.7 kg)    Goal Weight: Short Term 152 lb (68.9 kg)    Goal Weight: Long Term 152 lb (68.9 kg)    Expected Outcomes Short Term: Continue to assess and modify interventions until short term weight is achieved;Long Term: Adherence to nutrition and physical activity/exercise program aimed toward attainment of established weight goal;Weight Maintenance: Understanding of the daily nutrition guidelines, which includes 25-35% calories from fat, 7%  or less cal from saturated fats, less than 200mg  cholesterol, less than 1.5gm of sodium, & 5 or more servings of fruits and vegetables daily    Hypertension Yes    Intervention Provide education on lifestyle modifcations including regular physical activity/exercise, weight management, moderate sodium restriction and increased consumption of fresh fruit, vegetables, and low fat dairy, alcohol moderation, and smoking cessation.;Monitor prescription use compliance.    Expected Outcomes Short Term: Continued assessment and intervention until BP is < 140/49mm HG in hypertensive participants. < 130/62mm HG in hypertensive participants with diabetes, heart failure or chronic kidney  disease.;Long Term: Maintenance of blood pressure at goal levels.    Lipids Yes    Intervention Provide education and support for participant on nutrition & aerobic/resistive exercise along with prescribed medications to achieve LDL 70mg , HDL >40mg .    Expected Outcomes Short Term: Participant states understanding of desired cholesterol values and is compliant with medications prescribed. Participant is following exercise prescription and nutrition guidelines.;Long Term: Cholesterol controlled with medications as prescribed, with individualized exercise RX and with personalized nutrition plan. Value goals: LDL < 70mg , HDL > 40 mg.           Tobacco Use Initial Evaluation: Social History   Tobacco Use  Smoking Status Former Smoker  . Packs/day: 2.00  . Years: 10.00  . Pack years: 20.00  . Types: Cigarettes  . Quit date: 04/24/1970  . Years since quitting: 50.1  Smokeless Tobacco Never Used    Exercise Goals and Review:  Exercise Goals    Row Name 05/20/20 1213             Exercise Goals   Increase Physical Activity Yes       Intervention Provide advice, education, support and counseling about physical activity/exercise needs.;Develop an individualized exercise prescription for aerobic and resistive training based on initial evaluation findings, risk stratification, comorbidities and participant's personal goals.       Expected Outcomes Short Term: Attend rehab on a regular basis to increase amount of physical activity.;Long Term: Add in home exercise to make exercise part of routine and to increase amount of physical activity.;Long Term: Exercising regularly at least 3-5 days a week.       Increase Strength and Stamina Yes       Intervention Provide advice, education, support and counseling about physical activity/exercise needs.;Develop an individualized exercise prescription for aerobic and resistive training based on initial evaluation findings, risk stratification, comorbidities  and participant's personal goals.       Expected Outcomes Short Term: Increase workloads from initial exercise prescription for resistance, speed, and METs.;Short Term: Perform resistance training exercises routinely during rehab and add in resistance training at home;Long Term: Improve cardiorespiratory fitness, muscular endurance and strength as measured by increased METs and functional capacity (06/23/1970)       Able to understand and use rate of perceived exertion (RPE) scale Yes       Intervention Provide education and explanation on how to use RPE scale       Expected Outcomes Short Term: Able to use RPE daily in rehab to express subjective intensity level;Long Term:  Able to use RPE to guide intensity level when exercising independently       Able to understand and use Dyspnea scale Yes       Intervention Provide education and explanation on how to use Dyspnea scale       Expected Outcomes Short Term: Able to use Dyspnea scale daily in rehab to express subjective  sense of shortness of breath during exertion;Long Term: Able to use Dyspnea scale to guide intensity level when exercising independently       Knowledge and understanding of Target Heart Rate Range (THRR) Yes       Intervention Provide education and explanation of THRR including how the numbers were predicted and where they are located for reference       Expected Outcomes Short Term: Able to state/look up THRR;Short Term: Able to use daily as guideline for intensity in rehab;Long Term: Able to use THRR to govern intensity when exercising independently       Able to check pulse independently Yes       Intervention Provide education and demonstration on how to check pulse in carotid and radial arteries.;Review the importance of being able to check your own pulse for safety during independent exercise       Expected Outcomes Short Term: Able to explain why pulse checking is important during independent exercise;Long Term: Able to check pulse  independently and accurately       Understanding of Exercise Prescription Yes       Intervention Provide education, explanation, and written materials on patient's individual exercise prescription       Expected Outcomes Short Term: Able to explain program exercise prescription;Long Term: Able to explain home exercise prescription to exercise independently              Copy of goals given to participant.

## 2020-05-20 NOTE — Progress Notes (Signed)
Pt's oxygen saturation dropped significantly during .  Dropped below 88% around 1:30 and to 79% at 5:30 and test was terminated.  Pt was very SOB and hyperventilating so used pursed lip breathing to regain breath control and improve saturations.  We will have him do intermittent exercise to start rehab especially for weight bearing activity.  Note routed to PCP and Pulmonologist.    05/20/20 1202  6 Minute Walk  Phase Initial  Distance 1195 feet  Walk Time 5.5 minutes  # of Rest Breaks 0 (test stopped due to desaturation)  MPH 2.46  METS 2.97  RPE 13  Perceived Dyspnea  3  VO2 Peak 10.41  Symptoms Yes (comment)  Comments SOB  Resting HR 92 bpm  Resting BP 146/84  Resting Oxygen Saturation  94 %  Exercise Oxygen Saturation  during 6 min walk 78 %  Max Ex. HR 117 bpm  Max Ex. BP 172/74  2 Minute Post BP 156/74  Interval HR  Interval Heart Rate? Yes  1 Minute HR 104  2 Minute HR 108  3 Minute HR 108  4 Minute HR 112  5 Minute HR 110  6 Minute HR 117  2 Minute Post HR 94  Interval Oxygen  Interval Oxygen? Yes  Baseline Oxygen Saturation % 94 %  1 Minute Oxygen Saturation % 90 %  1 Minute Liters of Oxygen 0 L (Room Air)  2 Minute Oxygen Saturation % 86 %  2 Minute Liters of Oxygen 0 L  3 Minute Oxygen Saturation % 85 %  3 Minute Liters of Oxygen 0 L  4 Minute Oxygen Saturation % 81 %  4 Minute Liters of Oxygen 0 L  5 Minute Oxygen Saturation % 81 %  5 Minute Liters of Oxygen 0 L  6 Minute Oxygen Saturation % 79 % (test stopped at 5:30 lowest drop to 78% at 6:22)  6 Minute Liters of Oxygen 0 L  2 Minute Post Oxygen Saturation % 81 % (recheck at 9:42 93%)  2 Minute Post Liters of Oxygen 0 L   Fabio Pierce, MA, [place], CCRP 05/20/2020 12:07 PM

## 2020-05-25 ENCOUNTER — Other Ambulatory Visit: Payer: Self-pay

## 2020-05-25 ENCOUNTER — Encounter: Payer: Medicare Other | Attending: Internal Medicine | Admitting: *Deleted

## 2020-05-25 DIAGNOSIS — R0600 Dyspnea, unspecified: Secondary | ICD-10-CM | POA: Insufficient documentation

## 2020-05-25 DIAGNOSIS — U071 COVID-19: Secondary | ICD-10-CM | POA: Insufficient documentation

## 2020-05-25 NOTE — Progress Notes (Signed)
Daily Session Note  Patient Details  Name: Charles Davila MRN: [id number] Date of Birth: Nov 02, 1940 Referring Provider:   Flowsheet Row Pulmonary Rehab from 05/20/2020 in [facility] Cardiac and Pulmonary Rehab  Referring Provider [name]  [Pulmonologist: Dr. Valente David Aleskerov]      Encounter Date: 05/25/2020  Check In:  Session Check In - 05/25/20 1137      Check-In   Supervising physician immediately available to respond to emergencies See telemetry face sheet for immediately available ER MD    Location [facility]-Cardiac & Pulmonary Rehab    Staff Present Heath Lark, RN, BSN, CCRP;Joseph Hood RCP,RRT,BSRT;Melissa [place] RDN, Luther Redo, MPA, RN    Virtual Visit No    Medication changes reported     No    Fall or balance concerns reported    No    Warm-up and Cool-down Performed on first and last piece of equipment    Resistance Training Performed Yes    VAD Patient? No    PAD/SET Patient? No      Pain Assessment   Currently in Pain? No/denies              Social History   Tobacco Use  Smoking Status Former Smoker  . Packs/day: 2.00  . Years: 10.00  . Pack years: 20.00  . Types: Cigarettes  . Quit date: 04/24/1970  . Years since quitting: 50.1  Smokeless Tobacco Never Used    Goals Met:  Proper associated with RPD/PD & O2 Sat Exercise tolerated well Personal goals reviewed No report of cardiac concerns or symptoms  Goals Unmet:  Not Applicable  Comments: First full day of exercise!  Patient was oriented to gym and equipment including functions, settings, policies, and procedures.  Patient's individual exercise prescription and treatment plan were reviewed.  All starting workloads were established based on the results of the 6 minute walk test done at initial orientation visit.  The plan for exercise progression was also introduced and progression will be customized based on patient's performance and goals.    Dr. Emily Filbert is Medical Director for  [place] and [facility].

## 2020-05-27 ENCOUNTER — Other Ambulatory Visit: Payer: Self-pay

## 2020-05-27 DIAGNOSIS — U071 COVID-19: Secondary | ICD-10-CM | POA: Diagnosis not present

## 2020-05-27 DIAGNOSIS — R0600 Dyspnea, unspecified: Secondary | ICD-10-CM

## 2020-05-27 NOTE — Progress Notes (Signed)
Daily Session Note  Patient Details  Name: Tyquavious Gamel MRN: [id number] Date of Birth: 1940-05-11 Referring Provider:   Flowsheet Row Pulmonary Rehab from 05/20/2020 in [facility] Cardiac and Pulmonary Rehab  Referring Provider [name]  [Pulmonologist: Dr. Valente David Aleskerov]      Encounter Date: 05/27/2020  Check In:  Session Check In - 05/27/20 1101      Check-In   Supervising physician immediately available to respond to emergencies See telemetry face sheet for immediately available ER MD    Location [facility]-Cardiac & Pulmonary Rehab    Staff Present Birdie Sons, MPA, RN;Melissa Caiola RDN, LDN;Jessica Luan Pulling, MA, RCEP, CCRP, CCET;Meredith Sherryll Burger, RN BSN    Virtual Visit No    Medication changes reported     No    Fall or balance concerns reported    No    Warm-up and Cool-down Performed on first and last piece of equipment    Resistance Training Performed Yes    VAD Patient? No    PAD/SET Patient? No      Pain Assessment   Currently in Pain? No/denies              Social History   Tobacco Use  Smoking Status Former Smoker  . Packs/day: 2.00  . Years: 10.00  . Pack years: 20.00  . Types: Cigarettes  . Quit date: 04/24/1970  . Years since quitting: 50.1  Smokeless Tobacco Never Used    Goals Met:  Independence with exercise equipment Exercise tolerated well No report of cardiac concerns or symptoms Strength training completed today  Goals Unmet:  Not Applicable  Comments: Pt able to follow exercise prescription today without complaint.  Will continue to monitor for progression.    Dr. Emily Filbert is Medical Director for [place] and [facility].

## 2020-06-01 ENCOUNTER — Encounter: Payer: Medicare Other | Admitting: *Deleted

## 2020-06-01 ENCOUNTER — Other Ambulatory Visit: Payer: Self-pay

## 2020-06-01 DIAGNOSIS — U071 COVID-19: Secondary | ICD-10-CM | POA: Diagnosis not present

## 2020-06-01 DIAGNOSIS — R0600 Dyspnea, unspecified: Secondary | ICD-10-CM

## 2020-06-01 NOTE — Progress Notes (Signed)
Daily Session Note  Patient Details  Name: Deo Mehringer MRN: [id number] Date of Birth: 01/07/1941 Referring Provider:   Flowsheet Row Pulmonary Rehab from 05/20/2020 in [facility] Cardiac and Pulmonary Rehab  Referring Provider [name]  [Pulmonologist: Dr. Valente David Aleskerov]      Encounter Date: 06/01/2020  Check In:  Session Check In - 06/01/20 1126      Check-In   Supervising physician immediately available to respond to emergencies See telemetry face sheet for immediately available ER MD    Location [facility]-Cardiac & Pulmonary Rehab    Staff Present Heath Lark, RN, BSN, CCRP;Joseph Hood RCP,RRT,BSRT;Melissa Irena RDN, Rowe Pavy, BA, ACSM CEP, Exercise Physiologist    Virtual Visit No    Medication changes reported     No    Fall or balance concerns reported    No    Warm-up and Cool-down Performed on first and last piece of equipment    Resistance Training Performed Yes    VAD Patient? No    PAD/SET Patient? No      Pain Assessment   Currently in Pain? No/denies              Social History   Tobacco Use  Smoking Status Former Smoker  . Packs/day: 2.00  . Years: 10.00  . Pack years: 20.00  . Types: Cigarettes  . Quit date: 04/24/1970  . Years since quitting: 50.1  Smokeless Tobacco Never Used    Goals Met:  Proper associated with RPD/PD & O2 Sat Independence with exercise equipment Exercise tolerated well No report of cardiac concerns or symptoms  Goals Unmet:  O2 Sat  Is dropping to 86% at times. Exercise stoppped  Encourage purse lipped breathing and back  To exercise at lower workload.   Comments: Pt able to follow exercise prescription today without complaint.  Will continue to monitor for progression.    Dr. Emily Filbert is Medical Director for [place] and [facility].

## 2020-06-08 ENCOUNTER — Encounter: Payer: Medicare Other | Admitting: *Deleted

## 2020-06-08 ENCOUNTER — Other Ambulatory Visit: Payer: Self-pay

## 2020-06-08 DIAGNOSIS — U071 COVID-19: Secondary | ICD-10-CM

## 2020-06-08 DIAGNOSIS — R0600 Dyspnea, unspecified: Secondary | ICD-10-CM

## 2020-06-08 NOTE — Progress Notes (Signed)
Daily Session Note  Patient Details  Name: Charles Davila MRN: [id number] Date of Birth: 08/28/1940 Referring Provider:   Flowsheet Row Pulmonary Rehab from 05/20/2020 in [facility] Cardiac and Pulmonary Rehab  Referring Provider [name]  [Pulmonologist: Dr. Valente David Aleskerov]      Encounter Date: 06/08/2020  Check In:  Session Check In - 06/08/20 1121      Check-In   Supervising physician immediately available to respond to emergencies See telemetry face sheet for immediately available ER MD    Location [facility]-Cardiac & Pulmonary Rehab    Staff Present Heath Lark, RN, BSN, CCRP;Melissa [place] RDN, LDN;Joseph [employer], [place], ACSM CEP, Exercise Physiologist    Virtual Visit No    Medication changes reported     No    Fall or balance concerns reported    No    Warm-up and Cool-down Performed on first and last piece of equipment    Resistance Training Performed Yes    VAD Patient? No    PAD/SET Patient? No      Pain Assessment   Currently in Pain? No/denies              Social History   Tobacco Use  Smoking Status Former Smoker  . Packs/day: 2.00  . Years: 10.00  . Pack years: 20.00  . Types: Cigarettes  . Quit date: 04/24/1970  . Years since quitting: 50.1  Smokeless Tobacco Never Used    Goals Met:  Proper associated with RPD/PD & O2 Sat Independence with exercise equipment Exercise tolerated well No report of cardiac concerns or symptoms  Goals Unmet:  Not Applicable  Comments: Pt able to follow exercise prescription today without complaint.  Will continue to monitor for progression.    Dr. Emily Filbert is Medical Director for [place] and [facility].

## 2020-06-10 ENCOUNTER — Other Ambulatory Visit: Payer: Self-pay

## 2020-06-10 DIAGNOSIS — U071 COVID-19: Secondary | ICD-10-CM | POA: Diagnosis not present

## 2020-06-10 DIAGNOSIS — R0600 Dyspnea, unspecified: Secondary | ICD-10-CM

## 2020-06-10 NOTE — Progress Notes (Signed)
Daily Session Note  Patient Details  Name: Charles Davila MRN: [id number] Date of Birth: 07-20-1940 Referring Provider:   Flowsheet Row Pulmonary Rehab from 05/20/2020 in [facility] Cardiac and Pulmonary Rehab  Referring Provider [name]  [Pulmonologist: Dr. Valente David Aleskerov]      Encounter Date: 06/10/2020  Check In:  Session Check In - 06/10/20 1105      Check-In   Supervising physician immediately available to respond to emergencies See telemetry face sheet for immediately available ER MD    Location [facility]-Cardiac & Pulmonary Rehab    Staff Present Birdie Sons, MPA, RN;Melissa Caiola RDN, LDN;Meredith Sherryll Burger, RN BSN;Joseph Hood RCP,RRT,BSRT    Virtual Visit No    Medication changes reported     No    Fall or balance concerns reported    No    Warm-up and Cool-down Performed on first and last piece of equipment    Resistance Training Performed Yes    VAD Patient? No    PAD/SET Patient? No      Pain Assessment   Currently in Pain? No/denies              Social History   Tobacco Use  Smoking Status Former Smoker  . Packs/day: 2.00  . Years: 10.00  . Pack years: 20.00  . Types: Cigarettes  . Quit date: 04/24/1970  . Years since quitting: 50.1  Smokeless Tobacco Never Used    Goals Met:  Independence with exercise equipment Exercise tolerated well No report of cardiac concerns or symptoms Strength training completed today  Goals Unmet:  Not Applicable  Comments: Pt able to follow exercise prescription today without complaint.  Will continue to monitor for progression.    Dr. Emily Filbert is Medical Director for [place] and [facility].

## 2020-06-16 ENCOUNTER — Encounter: Payer: Self-pay | Admitting: *Deleted

## 2020-06-16 DIAGNOSIS — R0600 Dyspnea, unspecified: Secondary | ICD-10-CM

## 2020-06-16 DIAGNOSIS — U071 COVID-19: Secondary | ICD-10-CM

## 2020-06-16 NOTE — Progress Notes (Signed)
Pulmonary Individual Treatment Plan  Patient Details  Name: Razi Hickle MRN: [id number] Date of Birth: 01-22-1941 Referring Provider:   Flowsheet Row Pulmonary Rehab from 05/20/2020 in [facility] Card[facility] Pulmonary Rehab  Referring Provider Daniel Nones MD  [Pulmonologist: Dr. Rudi Heap Aleskerov]      Initial Encounter Date:  Flowsheet Row Pulmonary Rehab from 05/20/2020 in [facility] Cardiac and Pulmonary Rehab  Date 05/20/20      Visit Diagnosis: Dyspnea due to COVID-19  Patient's Home Medications on Admission:  Current Outpatient Medications:  .  acetaminophen (TYLENOL) 650 MG CR tablet, Take 650 mg by mouth every 6 (six) hours as needed., Disp: , Rfl:  .  albuterol (VENTOLIN HFA) 108 (90 Base) MCG/ACT inhaler, Inhale into the lungs., Disp: , Rfl:  .  pantoprazole (PROTONIX) 40 MG tablet, Take 1 tablet by mouth daily., Disp: , Rfl:  .  vardenafil (LEVITRA) 20 MG tablet, Take 20 mg by mouth as needed for erectile dysfunction. (Patient not taking: Reported on 05/17/2020), Disp: , Rfl:   Past Medical History: Past Medical History:  Diagnosis Date  . Arthritis     Tobacco Use: Social History   Tobacco Use  Smoking Status Former Smoker  . Packs/day: 2.00  . Years: 10.00  . Pack years: 20.00  . Types: Cigarettes  . Quit date: 04/24/1970  . Years since quitting: 50.1  Smokeless Tobacco Never Used    Labs: Recent Review Flowsheet Data   There is no flowsheet data to display.      Pulmonary Assessment Scores:  Pulmonary Assessment Scores    Row Name 05/20/20 1218         ADL UCSD   ADL Phase Entry     SOB Score total 67     Rest 1     Walk 3     Stairs 4     Bath 3     Dress 3     Shop 3           CAT Score   CAT Score 22           mMRC Score   mMRC Score 3            UCSD: Self-administered rating of dyspnea associated with activities of daily living (ADLs) 6-point scale (0 = "not at all" to 5 = "maximal or unable to do because of breathlessness")   Scoring Scores range from 0 to 120.  Minimally important difference is 5 units  CAT: CAT can identify the health impairment of COPD patients and is better correlated with disease progression.  CAT has a scoring range of zero to 40. The CAT score is classified into four groups of low (less than 10), medium (10 - 20), high (21-30) and very high (31-40) based on the impact level of disease on health status. A CAT score over 10 suggests significant symptoms.  A worsening CAT score could be explained by an exacerbation, poor medication adherence, poor inhaler technique, or progression of COPD or comorbid conditions.  CAT MCID is 2 points  mMRC: mMRC (Modified Medical Research Council) Dyspnea Scale is used to assess the degree of baseline functional disability in patients of respiratory disease due to dyspnea. No minimal important difference is established. A decrease in score of 1 point or greater is considered a positive change.   Pulmonary Function Assessment:  Pulmonary Function Assessment - 05/20/20 1218      Breath   Shortness of Breath Yes;Panic with Shortness of Breath;Fear of Shortness  of Breath;Limiting activity           Exercise Target Goals: Exercise Program Goal: Individual exercise prescription set using results from initial 6 min walk test and THRR while considering  patient's activity barriers and safety.   Exercise Prescription Goal: Initial exercise prescription builds to 30-45 minutes a day of aerobic activity, 2-3 days per week.  Home exercise guidelines will be given to patient during program as part of exercise prescription that the participant will acknowledge.  Education: Aerobic Exercise: - Group verbal and visual presentation on the components of exercise prescription. Introduces F.I.T.T principle from ACSM for exercise prescriptions.  Reviews F.I.T.T. principles of aerobic exercise including progression. Written material given at graduation.   Education:  Resistance Exercise: - Group verbal and visual presentation on the components of exercise prescription. Introduces F.I.T.T principle from ACSM for exercise prescriptions  Reviews F.I.T.T. principles of resistance exercise including progression. Written material given at graduation.    Education: Exercise & Equipment Safety: - Individual verbal instruction and demonstration of equipment use and safety with use of the equipment. Flowsheet Row Pulmonary Rehab from 05/20/2020 in [facility] Cardiac and Pulmonary Rehab  Date 05/20/20  Educator [facility]  Instruction Review Code 1- Verbalizes Understanding      Education: Exercise Physiology & General Exercise Guidelines: - Group verbal and written instruction with models to review the exercise physiology of the cardiovascular system and associated critical values. Provides general exercise guidelines with specific guidelines to those with heart or lung disease.    Education: Flexibility, Balance, Mind/Body Relaxation: - Group verbal and visual presentation with interactive activity on the components of exercise prescription. Introduces F.I.T.T principle from ACSM for exercise prescriptions. Reviews F.I.T.T. principles of flexibility and balance exercise training including progression. Also discusses the mind body connection.  Reviews various relaxation techniques to help reduce and manage stress (i.e. Deep breathing, progressive muscle relaxation, and visualization). Balance handout provided to take home. Written material given at graduation.   Activity Barriers & Risk Stratification:  Activity Barriers & Cardiac Risk Stratification - 05/20/20 1211      Activity Barriers & Cardiac Risk Stratification   Activity Barriers Shortness of Breath;Joint Problems;Neck/Spine Problems;Other (comment);Arthritis;Deconditioning;Muscular Weakness    Comments arthritis in shoulders, back, and neck; limited ROM in R shoulder (chronic)           6 Minute Walk:  6 Minute  Walk    Row Name 05/20/20 1202         6 Minute Walk   Phase Initial     Distance 1195 feet     Walk Time 5.5 minutes     # of Rest Breaks 0  test stopped due to desaturation     MPH 2.46     METS 2.97     RPE 13     Perceived Dyspnea  3     VO2 Peak 10.41     Symptoms Yes (comment)     Comments SOB     Resting HR 92 bpm     Resting BP 146/84     Resting Oxygen Saturation  94 %     Exercise Oxygen Saturation  during 6 min walk 78 %     Max Ex. HR 117 bpm     Max Ex. BP 172/74     2 Minute Post BP 156/74           Interval HR   1 Minute HR 104     2 Minute HR 108  3 Minute HR 108     4 Minute HR 112     5 Minute HR 110     6 Minute HR 117     2 Minute Post HR 94     Interval Heart Rate? Yes           Interval Oxygen   Interval Oxygen? Yes     Baseline Oxygen Saturation % 94 %     1 Minute Oxygen Saturation % 90 %     1 Minute Liters of Oxygen 0 L  Room Air     2 Minute Oxygen Saturation % 86 %     2 Minute Liters of Oxygen 0 L     3 Minute Oxygen Saturation % 85 %     3 Minute Liters of Oxygen 0 L     4 Minute Oxygen Saturation % 81 %     4 Minute Liters of Oxygen 0 L     5 Minute Oxygen Saturation % 81 %     5 Minute Liters of Oxygen 0 L     6 Minute Oxygen Saturation % 79 %  test stopped at 5:30 lowest drop to 78% at 6:22     6 Minute Liters of Oxygen 0 L     2 Minute Post Oxygen Saturation % 81 %  recheck at 9:42 93%     2 Minute Post Liters of Oxygen 0 L           Oxygen Initial Assessment:  Oxygen Initial Assessment - 05/20/20 1218      Home Oxygen   Home Oxygen Device None    Sleep Oxygen Prescription None    Home Exercise Oxygen Prescription None    Home Resting Oxygen Prescription None      Initial 6 min Walk   Oxygen Used None      Program Oxygen Prescription   Program Oxygen Prescription None      Intervention   Short Term Goals To learn and understand importance of maintaining oxygen saturations>88%;To learn and demonstrate proper  pursed lip breathing techniques or other breathing techniques.;To learn and demonstrate proper use of respiratory medications;To learn and understand importance of monitoring SPO2 with pulse oximeter and demonstrate accurate use of the pulse oximeter.    Long  Term Goals Maintenance of O2 saturations>88%;Exhibits proper breathing techniques, such as pursed lip breathing or other method taught during program session;Demonstrates proper use of MDI's;Compliance with respiratory medication;Verbalizes importance of monitoring SPO2 with pulse oximeter and return demonstration           Oxygen Re-Evaluation:  Oxygen Re-Evaluation    Row Name 05/25/20 1140             Program Oxygen Prescription   Program Oxygen Prescription None               Home Oxygen   Home Oxygen Device None       Sleep Oxygen Prescription None       Home Exercise Oxygen Prescription None       Home Resting Oxygen Prescription None               Goals/Expected Outcomes   Short Term Goals To learn and demonstrate proper pursed lip breathing techniques or other breathing techniques.;To learn and demonstrate proper use of respiratory medications;To learn and understand importance of monitoring SPO2 with pulse oximeter and demonstrate accurate use of the pulse oximeter.       Long  Term Goals  Verbalizes importance of monitoring SPO2 with pulse oximeter and return demonstration;Maintenance of O2 saturations>88%;Exhibits proper breathing techniques, such as pursed lip breathing or other method taught during program session;Compliance with respiratory medication;Demonstrates proper use of MDI's       Comments Reviewed PLB technique with pt.  Talked about how it works and it's importance in maintaining their exercise saturations.       Goals/Expected Outcomes Short: Become more profiecient at using PLB.   Long: Become independent at using PLB.              Oxygen Discharge (Final Oxygen Re-Evaluation):  Oxygen  Re-Evaluation - 05/25/20 1140      Program Oxygen Prescription   Program Oxygen Prescription None      Home Oxygen   Home Oxygen Device None    Sleep Oxygen Prescription None    Home Exercise Oxygen Prescription None    Home Resting Oxygen Prescription None      Goals/Expected Outcomes   Short Term Goals To learn and demonstrate proper pursed lip breathing techniques or other breathing techniques.;To learn and demonstrate proper use of respiratory medications;To learn and understand importance of monitoring SPO2 with pulse oximeter and demonstrate accurate use of the pulse oximeter.    Long  Term Goals Verbalizes importance of monitoring SPO2 with pulse oximeter and return demonstration;Maintenance of O2 saturations>88%;Exhibits proper breathing techniques, such as pursed lip breathing or other method taught during program session;Compliance with respiratory medication;Demonstrates proper use of MDI's    Comments Reviewed PLB technique with pt.  Talked about how it works and it's importance in maintaining their exercise saturations.    Goals/Expected Outcomes Short: Become more profiecient at using PLB.   Long: Become independent at using PLB.           Initial Exercise Prescription:  Initial Exercise Prescription - 05/20/20 1200      Date of Initial Exercise RX and Referring Provider   Date 05/20/20    Referring Provider Daniel Nones MD   Pulmonologist: Dr. Amanda Cockayne     Treadmill   MPH 2    Grade 0.5    Minutes 15    METs 2.67      Recumbant Bike   Level 2    RPM 50    Watts 22    Minutes 15    METs 2      NuStep   Level 2    SPM 80    Minutes 15    METs 2      Prescription Details   Frequency (times per week) 2    Duration Progress to 30 minutes of continuous aerobic without signs/symptoms of physical distress      Intensity   THRR 40-80% of Max Heartrate 112-133    Ratings of Perceived Exertion 11-13    Perceived Dyspnea 0-4      Progression    Progression Continue to progress workloads to maintain intensity without signs/symptoms of physical distress.      Resistance Training   Training Prescription Yes    Weight 3 lb    Reps 10-15           Perform Capillary Blood Glucose checks as needed.  Exercise Prescription Changes:  Exercise Prescription Changes    Row Name 05/20/20 1200 06/08/20 1300           Response to Exercise   Blood Pressure (Admit) 146/84 140/70      Blood Pressure (Exercise) 172/74 142/82  Blood Pressure (Exit) 152/62 138/64      Heart Rate (Admit) 92 bpm 100 bpm      Heart Rate (Exercise) 117 bpm 110 bpm      Heart Rate (Exit) 95 bpm 94 bpm      Oxygen Saturation (Admit) 94 % 90 %      Oxygen Saturation (Exercise) 78 % 92 %      Oxygen Saturation (Exit) 90 % 93 %      Rating of Perceived Exertion (Exercise) 13 13      Perceived Dyspnea (Exercise) 3 3      Symptoms SOB SOB      Comments walk test results --      Duration -- Progress to 30 minutes of  aerobic without signs/symptoms of physical distress      Intensity -- THRR unchanged             Progression   Progression -- Continue to progress workloads to maintain intensity without signs/symptoms of physical distress.      Average METs -- 2.7             Resistance Training   Training Prescription -- Yes      Weight -- 3 lb      Reps -- 10-15             Recumbant Bike   Level -- 2      RPM -- 50      Minutes -- 15      METs -- 3.12             NuStep   Level -- 2      SPM -- 80      Minutes -- 15      METs -- 2.3             Exercise Comments:  Exercise Comments    Row Name 05/25/20 1139           Exercise Comments First full day of exercise!  Patient was oriented to gym and equipment including functions, settings, policies, and procedures.  Patient's individual exercise prescription and treatment plan were reviewed.  All starting workloads were established based on the results of the 6 minute walk test done at  initial orientation visit.  The plan for exercise progression was also introduced and progression will be customized based on patient's performance and goals.              Exercise Goals and Review:  Exercise Goals    Row Name 05/20/20 1213             Exercise Goals   Increase Physical Activity Yes       Intervention Provide advice, education, support and counseling about physical activity/exercise needs.;Develop an individualized exercise prescription for aerobic and resistive training based on initial evaluation findings, risk stratification, comorbidities and participant's personal goals.       Expected Outcomes Short Term: Attend rehab on a regular basis to increase amount of physical activity.;Long Term: Add in home exercise to make exercise part of routine and to increase amount of physical activity.;Long Term: Exercising regularly at least 3-5 days a week.       Increase Strength and Stamina Yes       Intervention Provide advice, education, support and counseling about physical activity/exercise needs.;Develop an individualized exercise prescription for aerobic and resistive training based on initial evaluation findings, risk stratification, comorbidities and participant's personal goals.  Expected Outcomes Short Term: Increase workloads from initial exercise prescription for resistance, speed, and METs.;Short Term: Perform resistance training exercises routinely during rehab and add in resistance training at home;Long Term: Improve cardiorespiratory fitness, muscular endurance and strength as measured by increased METs and functional capacity ( )       Able to understand and use rate of perceived exertion (RPE) scale Yes       Intervention Provide education and explanation on how to use RPE scale       Expected Outcomes Short Term: Able to use RPE daily in rehab to express subjective intensity level;Long Term:  Able to use RPE to guide intensity level when exercising  independently       Able to understand and use Dyspnea scale Yes       Intervention Provide education and explanation on how to use Dyspnea scale       Expected Outcomes Short Term: Able to use Dyspnea scale daily in rehab to express subjective sense of shortness of breath during exertion;Long Term: Able to use Dyspnea scale to guide intensity level when exercising independently       Knowledge and understanding of Target Heart Rate Range (THRR) Yes       Intervention Provide education and explanation of THRR including how the numbers were predicted and where they are located for reference       Expected Outcomes Short Term: Able to state/look up THRR;Short Term: Able to use daily as guideline for intensity in rehab;Long Term: Able to use THRR to govern intensity when exercising independently       Able to check pulse independently Yes       Intervention Provide education and demonstration on how to check pulse in carotid and radial arteries.;Review the importance of being able to check your own pulse for safety during independent exercise       Expected Outcomes Short Term: Able to explain why pulse checking is important during independent exercise;Long Term: Able to check pulse independently and accurately       Understanding of Exercise Prescription Yes       Intervention Provide education, explanation, and written materials on patient's individual exercise prescription       Expected Outcomes Short Term: Able to explain program exercise prescription;Long Term: Able to explain home exercise prescription to exercise independently              Exercise Goals Re-Evaluation :  Exercise Goals Re-Evaluation    Row Name 05/25/20 1140 06/08/20 1356           Exercise Goal Re-Evaluation   Exercise Goals Review Able to understand and use rate of perceived exertion (RPE) scale;Able to understand and use Dyspnea scale;Knowledge and understanding of Target Heart Rate Range (THRR);Understanding of  Exercise Prescription Increase Physical Activity;Increase Strength and Stamina      Comments Reviewed RPE and dyspnea scales, THR and program prescription with pt today.  Pt voiced understanding and was given a copy of goals to take home. Ham has athritis that inhibits him some with exercise.  He started prednisone yesterday.  We will monitor progress.      Expected Outcomes Short: Use RPE daily to regulate intensity. Long: Follow program prescription in THR. Short : attend consistently Long:  improve arthritis symptoms and overall stamina             Discharge Exercise Prescription (Final Exercise Prescription Changes):  Exercise Prescription Changes - 06/08/20 1300      Response to  Exercise   Blood Pressure (Admit) 140/70    Blood Pressure (Exercise) 142/82    Blood Pressure (Exit) 138/64    Heart Rate (Admit) 100 bpm    Heart Rate (Exercise) 110 bpm    Heart Rate (Exit) 94 bpm    Oxygen Saturation (Admit) 90 %    Oxygen Saturation (Exercise) 92 %    Oxygen Saturation (Exit) 93 %    Rating of Perceived Exertion (Exercise) 13    Perceived Dyspnea (Exercise) 3    Symptoms SOB    Duration Progress to 30 minutes of  aerobic without signs/symptoms of physical distress    Intensity THRR unchanged      Progression   Progression Continue to progress workloads to maintain intensity without signs/symptoms of physical distress.    Average METs 2.7      Resistance Training   Training Prescription Yes    Weight 3 lb    Reps 10-15      Recumbant Bike   Level 2    RPM 50    Minutes 15    METs 3.12      NuStep   Level 2    SPM 80    Minutes 15    METs 2.3           Nutrition:  Target Goals: Understanding of nutrition guidelines, daily intake of sodium 1500mg , cholesterol 200mg , calories 30% from fat and 7% or less from saturated fats, daily to have 5 or more servings of fruits and vegetables.  Education: All About Nutrition: -Group instruction provided by verbal, written  material, interactive activities, discussions, models, and posters to present general guidelines for heart healthy nutrition including fat, fiber, MyPlate, the role of sodium in heart healthy nutrition, utilization of the nutrition label, and utilization of this knowledge for meal planning. Follow up email sent as well. Written material given at graduation.   Biometrics:  Pre Biometrics - 05/20/20 1214      Pre Biometrics   Height 5' 6.1" (1.679 m)    Weight 155 lb 14.4 oz (70.7 kg)    BMI (Calculated) 25.09    Single Leg Stand 30 seconds            Nutrition Therapy Plan and Nutrition Goals:  Nutrition Therapy & Goals - 05/27/20 1214      Personal Nutrition Goals   Comments Pt would not like to meet with dietitian           Nutrition Assessments:  MEDIFICTS Score Key:  ?70 Need to make dietary changes   40-70 Heart Healthy Diet  ? 40 Therapeutic Level Cholesterol Diet  Flowsheet Row Pulmonary Rehab from 05/20/2020 in [facility] Cardiac and Pulmonary Rehab  Picture Your Plate Total Score on Admission 57     Picture Your Plate Scores:  <16 Unhealthy dietary pattern with much room for improvement.  41-50 Dietary pattern unlikely to meet recommendations for good health and room for improvement.  51-60 More healthful dietary pattern, with some room for improvement.   >60 Healthy dietary pattern, although there may be some specific behaviors that could be improved.   Nutrition Goals Re-Evaluation:   Nutrition Goals Discharge (Final Nutrition Goals Re-Evaluation):   Psychosocial: Target Goals: Acknowledge presence or absence of significant depression and/or stress, maximize coping skills, provide positive support system. Participant is able to verbalize types and ability to use techniques and skills needed for reducing stress and depression.   Education: Stress, Anxiety, and Depression - Group verbal and visual presentation  to define topics covered.  Reviews how body  is impacted by stress, anxiety, and depression.  Also discusses healthy ways to reduce stress and to treat/manage anxiety and depression.  Written material given at graduation. Flowsheet Row Pulmonary Rehab from 05/20/2020 in [facility] Cardiac and Pulmonary Rehab  Education need identified 05/20/20      Education: Sleep Hygiene -Provides group verbal and written instruction about how sleep can affect your health.  Define sleep hygiene, discuss sleep cycles and impact of sleep habits. Review good sleep hygiene tips.    Initial Review & Psychosocial Screening:  Initial Psych Review & Screening - 05/17/20 1119      Initial Review   Current issues with Current Stress Concerns    Source of Stress Concerns Unable to perform yard/household activities      Family Dynamics   Good Support System? Yes   wife, large family- son & daughter their children, friends     Barriers   Psychosocial barriers to participate in program There are no identifiable barriers or psychosocial needs.      Screening Interventions   Interventions Encouraged to exercise    Expected Outcomes Short Term goal: Utilizing psychosocial counselor, staff and physician to assist with identification of specific Stressors or current issues interfering with healing process. Setting desired goal for each stressor or current issue identified.;Long Term Goal: Stressors or current issues are controlled or eliminated.;Short Term goal: Identification and review with participant of any Quality of Life or Depression concerns found by scoring the questionnaire.;Long Term goal: The participant improves quality of Life and PHQ9 Scores as seen by post scores and/or verbalization of changes           Quality of Life Scores:  Scores of 19 and below usually indicate a poorer quality of life in these areas.  A difference of  2-3 points is a clinically meaningful difference.  A difference of 2-3 points in the total score of the Quality of Life Index  has been associated with significant improvement in overall quality of life, self-image, physical symptoms, and general health in studies assessing change in quality of life.  PHQ-9: Recent Review Flowsheet Data    Depression screen [facility] 2/9 05/20/2020   Decreased Interest 1   Down, Depressed, Hopeless 1   PHQ - 2 Score 2   Altered sleeping 2   Tired, decreased energy 3   Change in appetite 0   Feeling bad or failure about yourself  3   Trouble concentrating 1   Moving slowly or fidgety/restless 2   Suicidal thoughts 1   PHQ-9 Score 14   Difficult doing work/chores Somewhat difficult     Interpretation of Total Score  Total Score Depression Severity:  1-4 = Minimal depression, 5-9 = Mild depression, 10-14 = Moderate depression, 15-19 = Moderately severe depression, 20-27 = Severe depression   Psychosocial Evaluation and Intervention:  Psychosocial Evaluation - 05/17/20 1142      Psychosocial Evaluation & Interventions   Interventions Encouraged to exercise with the program and follow exercise prescription    Comments [place] He has a great support system with his wife, son and daughter and their families. He also has friends around that he feels would help if needed. HIs goal is to improve his shortness of breath, it is keeping him from his daily activites. His wife has been heklping with the chores that Ham cannot complete. She has had to help  with the yardwork. He can ride lawnmower, and she helps with the trimming. He wants to be able to manage his chores without shortness of breath. He is hoping this program will assist him in making the goal. He should do well in the program, he has no barriers to attending.    Expected Outcomes STG: Ham attends all scheduled sessions, his Shortness of breath improves enough to allow him to manage his chores.  LTG: Ham continues to utilize the resources and tools he received during the program  and he continues to see improvement with his shortness of breath    Continue Psychosocial Services  Follow up required by staff           Psychosocial Re-Evaluation:   Psychosocial Discharge (Final Psychosocial Re-Evaluation):   Education: Education Goals: Education classes will be provided on a weekly basis, covering required topics. Participant will state understanding/return demonstration of topics presented.  Learning Barriers/Preferences:  Learning Barriers/Preferences - 05/17/20 1121      Learning Barriers/Preferences   Learning Barriers None    Learning Preferences None           General Pulmonary Education Topics:  Infection Prevention: - Provides verbal and written material to individual with discussion of infection control including proper hand washing and proper equipment cleaning during exercise session. Flowsheet Row Pulmonary Rehab from 05/20/2020 in [facility]  Date 05/20/20  Educator [facility]  Instruction Review Code 1- Verbalizes Understanding      Falls Prevention: - Provides verbal and written material to individual with discussion of falls prevention and safety. Flowsheet Row Pulmonary Rehab from 05/20/2020 in [facility] Cardiac and Pulmonary Rehab  Date 05/17/20  Educator SB  Instruction Review Code 1- Verbalizes Understanding      Chronic Lung Disease Review: - Group verbal instruction with posters, models, PowerPoint presentations and videos,  to review new updates, new respiratory medications, new advancements in procedures and treatments. Providing information on websites and "800" numbers for continued self-education. Includes information about supplement oxygen, available portable oxygen systems, continuous and intermittent flow rates, oxygen safety, concentrators, and Medicare reimbursement for oxygen. Explanation of Pulmonary Drugs, including class, frequency, complications, importance of spacers, rinsing mouth after steroid MDI's,  and proper cleaning methods for nebulizers. Review of basic lung anatomy and physiology related to function, structure, and complications of lung disease. Review of risk factors. Discussion about methods for diagnosing sleep apnea and types of masks and machines for OSA. Includes a review of the use of types of environmental controls: home humidity, furnaces, filters, dust mite/pet prevention, HEPA vacuums. Discussion about weather changes, air quality and the benefits of nasal washing. Instruction on Warning signs, infection symptoms, calling MD promptly, preventive modes, and value of vaccinations. Review of effective airway clearance, coughing and/or vibration techniques. Emphasizing that all should Create an Action Plan. Written material given at graduation. Flowsheet Row Pulmonary Rehab from 05/20/2020 in [facility] Cardiac and Pulmonary Rehab  Education need identified 05/20/20      AED/CPR: - Group verbal and written instruction with the use of models to demonstrate the basic use of the AED with the basic ABC's of resuscitation.    Anatomy and Cardiac Procedures: - Group verbal and visual presentation and models provide information about basic cardiac anatomy and function. Reviews the testing methods done to diagnose heart disease and the outcomes of the test results. Describes the treatment choices: Medical Management, Angioplasty, or Coronary Bypass Surgery for treating various heart conditions including Myocardial Infarction, Angina, Valve Disease,  and Cardiac Arrhythmias.  Written material given at graduation.   Medication Safety: - Group verbal and visual instruction to review commonly prescribed medications for heart and lung disease. Reviews the medication, class of the drug, and side effects. Includes the steps to properly store meds and maintain the prescription regimen.  Written material given at graduation.   Other: -Provides group and verbal instruction on various topics (see  comments)   Knowledge Questionnaire Score:  Knowledge Questionnaire Score - 05/20/20 1215      Knowledge Questionnaire Score   Pre Score 15/18 Education Focus: O2 safety            Core Components/Risk Factors/Patient Goals at Admission:  Personal Goals and Risk Factors at Admission - 05/20/20 1215      Core Components/Risk Factors/Patient Goals on Admission    Weight Management Yes;Weight Maintenance    Intervention Weight Management: Develop a combined nutrition and exercise program designed to reach desired caloric intake, while maintaining appropriate intake of nutrient and fiber, sodium and fats, and appropriate energy expenditure required for the weight goal.;Weight Management: Provide education and appropriate resources to help participant work on and attain dietary goals.    Admit Weight 155 lb 14.4 oz (70.7 kg)    Goal Weight: Short Term 152 lb (68.9 kg)    Goal Weight: Long Term 152 lb (68.9 kg)    Expected Outcomes Short Term: Continue to assess and modify interventions until short term weight is achieved;Long Term: Adherence to nutrition and physical activity/exercise program aimed toward attainment of established weight goal;Weight Maintenance: Understanding of the daily nutrition guidelines, which includes 25-35% calories from fat, 7% or less cal from saturated fats, less than 200mg  cholesterol, less than 1.5gm of sodium, & 5 or more servings of fruits and vegetables daily    Hypertension Yes    Intervention Provide education on lifestyle modifcations including regular physical activity/exercise, weight management, moderate sodium restriction and increased consumption of fresh fruit, vegetables, and low fat dairy, alcohol moderation, and smoking cessation.;Monitor prescription use compliance.    Expected Outcomes Short Term: Continued assessment and intervention until BP is < 140/63mm HG in hypertensive participants. < 130/40mm HG in hypertensive participants with diabetes,  heart failure or chronic kidney disease.;Long Term: Maintenance of blood pressure at goal levels.    Lipids Yes    Intervention Provide education and support for participant on nutrition & aerobic/resistive exercise along with prescribed medications to achieve LDL 70mg , HDL >40mg .    Expected Outcomes Short Term: Participant states understanding of desired cholesterol values and is compliant with medications prescribed. Participant is following exercise prescription and nutrition guidelines.;Long Term: Cholesterol controlled with medications as prescribed, with individualized exercise RX and with personalized nutrition plan. Value goals: LDL < 70mg , HDL > 40 mg.           Education:Diabetes - Individual verbal and written instruction to review signs/symptoms of diabetes, desired ranges of glucose level fasting, after meals and with exercise. Acknowledge that pre and post exercise glucose checks will be done for 3 sessions at entry of program.   Know Your Numbers and Heart Failure: - Group verbal and visual instruction to discuss disease risk factors for cardiac and pulmonary disease and treatment options.  Reviews associated critical values for Overweight/Obesity, Hypertension, Cholesterol, and Diabetes.  Discusses basics of heart failure: signs/symptoms and treatments.  Introduces Heart Failure Zone chart for action plan for heart failure.  Written material given at graduation.   Core Components/Risk Factors/Patient Goals Review:  Core Components/Risk Factors/Patient Goals at Discharge (Final Review):    ITP Comments:  ITP Comments    Row Name 05/17/20 1141 05/20/20 1201 05/25/20 1139 06/16/20 0645     ITP Comments Virtual orientation call completed today. he has an appointment on 05/20/2020 for EP eval and gym Orientation.  [id number] Completed 6MWT and gym orientation. Initial ITP created and sent for review to Dr. Bethann PunchesMark Miller,  Medical Director. First full day of exercise!  Patient was oriented to gym and equipment including functions, settings, policies, and procedures.  Patient's individual exercise prescription and treatment plan were reviewed.  All starting workloads were established based on the results of the 6 minute walk test done at initial orientation visit.  The plan for exercise progression was also introduced and progression will be customized based on patient's performance and goals. 30 Day review completed. Medical Director ITP review done, changes made as directed, and signed approval by Medical Director.           Comments:

## 2020-06-22 ENCOUNTER — Encounter: Payer: Medicare Other | Attending: Internal Medicine

## 2020-06-22 ENCOUNTER — Other Ambulatory Visit: Payer: Self-pay

## 2020-06-22 DIAGNOSIS — U071 COVID-19: Secondary | ICD-10-CM | POA: Insufficient documentation

## 2020-06-22 DIAGNOSIS — R0600 Dyspnea, unspecified: Secondary | ICD-10-CM | POA: Insufficient documentation

## 2020-06-24 DIAGNOSIS — U071 COVID-19: Secondary | ICD-10-CM

## 2020-06-24 DIAGNOSIS — R0600 Dyspnea, unspecified: Secondary | ICD-10-CM

## 2020-06-24 NOTE — Progress Notes (Signed)
Pulmonary Individual Treatment Plan  Patient Details  Name: Charles Davila MRN: [id number] Date of Birth: 1941-02-06 Referring Provider:   Flowsheet Row Pulmonary Rehab from 05/20/2020 in [facility] Cardiac and Pulmonary Rehab  Referring Provider Daniel Nones MD  [Pulmonologist: Dr. Rudi Heap Aleskerov]      Initial Encounter Date:  Flowsheet Row Pulmonary Rehab from 05/20/2020 in [facility] Cardiac and Pulmonary Rehab  Date 05/20/20      Visit Diagnosis: Dyspnea due to COVID-19  Patient's Home Medications on Admission:  Current Outpatient Medications:  .  acetaminophen (TYLENOL) 650 MG CR tablet, Take 650 mg by mouth every 6 (six) hours as needed., Disp: , Rfl:  .  albuterol (VENTOLIN HFA) 108 (90 Base) MCG/ACT inhaler, Inhale into the lungs., Disp: , Rfl:  .  pantoprazole (PROTONIX) 40 MG tablet, Take 1 tablet by mouth daily., Disp: , Rfl:  .  vardenafil (LEVITRA) 20 MG tablet, Take 20 mg by mouth as needed for erectile dysfunction. (Patient not taking: Reported on 05/17/2020), Disp: , Rfl:   Past Medical History: Past Medical History:  Diagnosis Date  . Arthritis     Tobacco Use: Social History   Tobacco Use  Smoking Status Former Smoker  . Packs/day: 2.00  . Years: 10.00  . Pack years: 20.00  . Types: Cigarettes  . Quit date: 04/24/1970  . Years since quitting: 50.2  Smokeless Tobacco Never Used    Labs: Recent Review Flowsheet Data   There is no flowsheet data to display.      Pulmonary Assessment Scores:  Pulmonary Assessment Scores    Row Name 05/20/20 1218         ADL UCSD   ADL Phase Entry     SOB Score total 67     Rest 1     Walk 3     Stairs 4     Bath 3     Dress 3     Shop 3           CAT Score   CAT Score 22           mMRC Score   mMRC Score 3            UCSD: Self-administered rating of dyspnea associated with activities of daily living (ADLs) 6-point scale (0 = "not at all" to 5 = "maximal or unable to do because of breathlessness")   Scoring Scores range from 0 to 120.  Minimally important difference is 5 units  CAT: CAT can identify the health impairment of COPD patients and is better correlated with disease progression.  CAT has a scoring range of zero to 40. The CAT score is classified into four groups of low (less than 10), medium (10 - 20), high (21-30) and very high (31-40) based on the impact level of disease on health status. A CAT score over 10 suggests significant symptoms.  A worsening CAT score could be explained by an exacerbation, poor medication adherence, poor inhaler technique, or progression of COPD or comorbid conditions.  CAT MCID is 2 points  mMRC: mMRC (Modified Medical Research Council) Dyspnea Scale is used to assess the degree of baseline functional disability in patients of respiratory disease due to dyspnea. No minimal important difference is established. A decrease in score of 1 point or greater is considered a positive change.   Pulmonary Function Assessment:  Pulmonary Function Assessment - 05/20/20 1218      Breath   Shortness of Breath Yes;Panic with Shortness of Breath;Fear of Shortness  of Breath;Limiting activity           Exercise Target Goals: Exercise Program Goal: Individual exercise prescription set using results from initial 6 min walk test and THRR while considering  patient's activity barriers and safety.   Exercise Prescription Goal: Initial exercise prescription builds to 30-45 minutes a day of aerobic activity, 2-3 days per week.  Home exercise guidelines will be given to patient during program as part of exercise prescription that the participant will acknowledge.  Education: Aerobic Exercise: - Group verbal and visual presentation on the components of exercise prescription. Introduces F.I.T.T principle from ACSM for exercise prescriptions.  Reviews F.I.T.T. principles of aerobic exercise including progression. Written material given at graduation.   Education:  Resistance Exercise: - Group verbal and visual presentation on the components of exercise prescription. Introduces F.I.T.T principle from ACSM for exercise prescriptions  Reviews F.I.T.T. principles of resistance exercise including progression. Written material given at graduation.    Education: Exercise & Equipment Safety: - Individual verbal instruction and demonstration of equipment use and safety with use of the equipment. Flowsheet Row Pulmonary Rehab from 05/20/2020 in [facility] Cardiac and Pulmonary Rehab  Date 05/20/20  Educator [facility]  Instruction Review Code 1- Verbalizes Understanding      Education: Exercise Physiology & General Exercise Guidelines: - Group verbal and written instruction with models to review the exercise physiology of the cardiovascular system and associated critical values. Provides general exercise guidelines with specific guidelines to those with heart or lung disease.    Education: Flexibility, Balance, Mind/Body Relaxation: - Group verbal and visual presentation with interactive activity on the components of exercise prescription. Introduces F.I.T.T principle from ACSM for exercise prescriptions. Reviews F.I.T.T. principles of flexibility and balance exercise training including progression. Also discusses the mind body connection.  Reviews various relaxation techniques to help reduce and manage stress (i.e. Deep breathing, progressive muscle relaxation, and visualization). Balance handout provided to take home. Written material given at graduation.   Activity Barriers & Risk Stratification:  Activity Barriers & Cardiac Risk Stratification - 05/20/20 1211      Activity Barriers & Cardiac Risk Stratification   Activity Barriers Shortness of Breath;Joint Problems;Neck/Spine Problems;Other (comment);Arthritis;Deconditioning;Muscular Weakness    Comments arthritis in shoulders, back, and neck; limited ROM in R shoulder (chronic)           6 Minute Walk:  6 Minute  Walk    Row Name 05/20/20 1202         6 Minute Walk   Phase Initial     Distance 1195 feet     Walk Time 5.5 minutes     # of Rest Breaks 0  test stopped due to desaturation     MPH 2.46     METS 2.97     RPE 13     Perceived Dyspnea  3     VO2 Peak 10.41     Symptoms Yes (comment)     Comments SOB     Resting HR 92 bpm     Resting BP 146/84     Resting Oxygen Saturation  94 %     Exercise Oxygen Saturation  during 6 min walk 78 %     Max Ex. HR 117 bpm     Max Ex. BP 172/74     2 Minute Post BP 156/74           Interval HR   1 Minute HR 104     2 Minute HR 108  3 Minute HR 108     4 Minute HR 112     5 Minute HR 110     6 Minute HR 117     2 Minute Post HR 94     Interval Heart Rate? Yes           Interval Oxygen   Interval Oxygen? Yes     Baseline Oxygen Saturation % 94 %     1 Minute Oxygen Saturation % 90 %     1 Minute Liters of Oxygen 0 L  Room Air     2 Minute Oxygen Saturation % 86 %     2 Minute Liters of Oxygen 0 L     3 Minute Oxygen Saturation % 85 %     3 Minute Liters of Oxygen 0 L     4 Minute Oxygen Saturation % 81 %     4 Minute Liters of Oxygen 0 L     5 Minute Oxygen Saturation % 81 %     5 Minute Liters of Oxygen 0 L     6 Minute Oxygen Saturation % 79 %  test stopped at 5:30 lowest drop to 78% at 6:22     6 Minute Liters of Oxygen 0 L     2 Minute Post Oxygen Saturation % 81 %  recheck at 9:42 93%     2 Minute Post Liters of Oxygen 0 L           Oxygen Initial Assessment:  Oxygen Initial Assessment - 05/20/20 1218      Home Oxygen   Home Oxygen Device None    Sleep Oxygen Prescription None    Home Exercise Oxygen Prescription None    Home Resting Oxygen Prescription None      Initial 6 min Walk   Oxygen Used None      Program Oxygen Prescription   Program Oxygen Prescription None      Intervention   Short Term Goals To learn and understand importance of maintaining oxygen saturations>88%;To learn and demonstrate proper  pursed lip breathing techniques or other breathing techniques.;To learn and demonstrate proper use of respiratory medications;To learn and understand importance of monitoring SPO2 with pulse oximeter and demonstrate accurate use of the pulse oximeter.    Long  Term Goals Maintenance of O2 saturations>88%;Exhibits proper breathing techniques, such as pursed lip breathing or other method taught during program session;Demonstrates proper use of MDI's;Compliance with respiratory medication;Verbalizes importance of monitoring SPO2 with pulse oximeter and return demonstration           Oxygen Re-Evaluation:  Oxygen Re-Evaluation    Row Name 05/25/20 1140             Program Oxygen Prescription   Program Oxygen Prescription None               Home Oxygen   Home Oxygen Device None       Sleep Oxygen Prescription None       Home Exercise Oxygen Prescription None       Home Resting Oxygen Prescription None               Goals/Expected Outcomes   Short Term Goals To learn and demonstrate proper pursed lip breathing techniques or other breathing techniques.;To learn and demonstrate proper use of respiratory medications;To learn and understand importance of monitoring SPO2 with pulse oximeter and demonstrate accurate use of the pulse oximeter.       Long  Term Goals  Verbalizes importance of monitoring SPO2 with pulse oximeter and return demonstration;Maintenance of O2 saturations>88%;Exhibits proper breathing techniques, such as pursed lip breathing or other method taught during program session;Compliance with respiratory medication;Demonstrates proper use of MDI's       Comments Reviewed PLB technique with pt.  Talked about how it works and it's importance in maintaining their exercise saturations.       Goals/Expected Outcomes Short: Become more profiecient at using PLB.   Long: Become independent at using PLB.              Oxygen Discharge (Final Oxygen Re-Evaluation):  Oxygen  Re-Evaluation - 05/25/20 1140      Program Oxygen Prescription   Program Oxygen Prescription None      Home Oxygen   Home Oxygen Device None    Sleep Oxygen Prescription None    Home Exercise Oxygen Prescription None    Home Resting Oxygen Prescription None      Goals/Expected Outcomes   Short Term Goals To learn and demonstrate proper pursed lip breathing techniques or other breathing techniques.;To learn and demonstrate proper use of respiratory medications;To learn and understand importance of monitoring SPO2 with pulse oximeter and demonstrate accurate use of the pulse oximeter.    Long  Term Goals Verbalizes importance of monitoring SPO2 with pulse oximeter and return demonstration;Maintenance of O2 saturations>88%;Exhibits proper breathing techniques, such as pursed lip breathing or other method taught during program session;Compliance with respiratory medication;Demonstrates proper use of MDI's    Comments Reviewed PLB technique with pt.  Talked about how it works and it's importance in maintaining their exercise saturations.    Goals/Expected Outcomes Short: Become more profiecient at using PLB.   Long: Become independent at using PLB.           Initial Exercise Prescription:  Initial Exercise Prescription - 05/20/20 1200      Date of Initial Exercise RX and Referring Provider   Date 05/20/20    Referring Provider Daniel Nones MD   Pulmonologist: Dr. Amanda Cockayne     Treadmill   MPH 2    Grade 0.5    Minutes 15    METs 2.67      Recumbant Bike   Level 2    RPM 50    Watts 22    Minutes 15    METs 2      NuStep   Level 2    SPM 80    Minutes 15    METs 2      Prescription Details   Frequency (times per week) 2    Duration Progress to 30 minutes of continuous aerobic without signs/symptoms of physical distress      Intensity   THRR 40-80% of Max Heartrate 112-133    Ratings of Perceived Exertion 11-13    Perceived Dyspnea 0-4      Progression    Progression Continue to progress workloads to maintain intensity without signs/symptoms of physical distress.      Resistance Training   Training Prescription Yes    Weight 3 lb    Reps 10-15           Perform Capillary Blood Glucose checks as needed.  Exercise Prescription Changes:  Exercise Prescription Changes    Row Name 05/20/20 1200 06/08/20 1300 06/23/20 1400         Response to Exercise   Blood Pressure (Admit) 146/84 140/70 134/68     Blood Pressure (Exercise) 172/74 142/82 150/74  Blood Pressure (Exit) 152/62 138/64 132/62     Heart Rate (Admit) 92 bpm 100 bpm 101 bpm     Heart Rate (Exercise) 117 bpm 110 bpm 106 bpm     Heart Rate (Exit) 95 bpm 94 bpm 103 bpm     Oxygen Saturation (Admit) 94 % 90 % 93 %     Oxygen Saturation (Exercise) 78 % 92 % 90 %     Oxygen Saturation (Exit) 90 % 93 % 93 %     Rating of Perceived Exertion (Exercise) 13 13 12      Perceived Dyspnea (Exercise) 3 3 2      Symptoms SOB SOB SOB     Comments walk test results - -     Duration - Progress to 30 minutes of  aerobic without signs/symptoms of physical distress Progress to 30 minutes of  aerobic without signs/symptoms of physical distress     Intensity - THRR unchanged THRR unchanged           Progression   Progression - Continue to progress workloads to maintain intensity without signs/symptoms of physical distress. Continue to progress workloads to maintain intensity without signs/symptoms of physical distress.     Average METs - 2.7 2.9           Resistance Training   Training Prescription - Yes Yes     Weight - 3 lb 3 lb     Reps - 10-15 10-15           Interval Training   Interval Training - - No           Recumbant Bike   Level - 2 -     RPM - 50 -     Minutes - 15 -     METs - 3.12 -           NuStep   Level - 2 3     SPM - 80 -     Minutes - 15 30     METs - 2.3 2.9            Exercise Comments:  Exercise Comments    Row Name 05/25/20 1139            Exercise Comments First full day of exercise!  Patient was oriented to gym and equipment including functions, settings, policies, and procedures.  Patient's individual exercise prescription and treatment plan were reviewed.  All starting workloads were established based on the results of the 6 minute walk test done at initial orientation visit.  The plan for exercise progression was also introduced and progression will be customized based on patient's performance and goals.              Exercise Goals and Review:  Exercise Goals    Row Name 05/20/20 1213             Exercise Goals   Increase Physical Activity Yes       Intervention Provide advice, education, support and counseling about physical activity/exercise needs.;Develop an individualized exercise prescription for aerobic and resistive training based on initial evaluation findings, risk stratification, comorbidities and participant's personal goals.       Expected Outcomes Short Term: Attend rehab on a regular basis to increase amount of physical activity.;Long Term: Add in home exercise to make exercise part of routine and to increase amount of physical activity.;Long Term: Exercising regularly at least 3-5 days a week.       Increase Strength and  Stamina Yes       Intervention Provide advice, education, support and counseling about physical activity/exercise needs.;Develop an individualized exercise prescription for aerobic and resistive training based on initial evaluation findings, risk stratification, comorbidities and participant's personal goals.       Expected Outcomes Short Term: Increase workloads from initial exercise prescription for resistance, speed, and METs.;Short Term: Perform resistance training exercises routinely during rehab and add in resistance training at home;Long Term: Improve cardiorespiratory fitness, muscular endurance and strength as measured by increased METs and functional capacity ( )       Able to  understand and use rate of perceived exertion (RPE) scale Yes       Intervention Provide education and explanation on how to use RPE scale       Expected Outcomes Short Term: Able to use RPE daily in rehab to express subjective intensity level;Long Term:  Able to use RPE to guide intensity level when exercising independently       Able to understand and use Dyspnea scale Yes       Intervention Provide education and explanation on how to use Dyspnea scale       Expected Outcomes Short Term: Able to use Dyspnea scale daily in rehab to express subjective sense of shortness of breath during exertion;Long Term: Able to use Dyspnea scale to guide intensity level when exercising independently       Knowledge and understanding of Target Heart Rate Range (THRR) Yes       Intervention Provide education and explanation of THRR including how the numbers were predicted and where they are located for reference       Expected Outcomes Short Term: Able to state/look up THRR;Short Term: Able to use daily as guideline for intensity in rehab;Long Term: Able to use THRR to govern intensity when exercising independently       Able to check pulse independently Yes       Intervention Provide education and demonstration on how to check pulse in carotid and radial arteries.;Review the importance of being able to check your own pulse for safety during independent exercise       Expected Outcomes Short Term: Able to explain why pulse checking is important during independent exercise;Long Term: Able to check pulse independently and accurately       Understanding of Exercise Prescription Yes       Intervention Provide education, explanation, and written materials on patient's individual exercise prescription       Expected Outcomes Short Term: Able to explain program exercise prescription;Long Term: Able to explain home exercise prescription to exercise independently              Exercise Goals Re-Evaluation :  Exercise  Goals Re-Evaluation    Row Name 05/25/20 1140 06/08/20 1356 06/23/20 1438         Exercise Goal Re-Evaluation   Exercise Goals Review Able to understand and use rate of perceived exertion (RPE) scale;Able to understand and use Dyspnea scale;Knowledge and understanding of Target Heart Rate Range (THRR);Understanding of Exercise Prescription Increase Physical Activity;Increase Strength and Stamina Increase Physical Activity;Increase Strength and Stamina;Understanding of Exercise Prescription     Comments Reviewed RPE and dyspnea scales, THR and program prescription with pt today.  Pt voiced understanding and was given a copy of goals to take home. Ham has athritis that inhibits him some with exercise.  He started prednisone yesterday.  We will monitor progress. Ham has only had one visit since last review due ot  other appointments. He is currently awaiting clearance from a muscle biospy.  We will continue to monitor his progress.     Expected Outcomes Short: Use RPE daily to regulate intensity. Long: Follow program prescription in THR. Short : attend consistently Long:  improve arthritis symptoms and overall stamina Short: get clearance to return  Long: Continue to improve stamina.            Discharge Exercise Prescription (Final Exercise Prescription Changes):  Exercise Prescription Changes - 06/23/20 1400      Response to Exercise   Blood Pressure (Admit) 134/68    Blood Pressure (Exercise) 150/74    Blood Pressure (Exit) 132/62    Heart Rate (Admit) 101 bpm    Heart Rate (Exercise) 106 bpm    Heart Rate (Exit) 103 bpm    Oxygen Saturation (Admit) 93 %    Oxygen Saturation (Exercise) 90 %    Oxygen Saturation (Exit) 93 %    Rating of Perceived Exertion (Exercise) 12    Perceived Dyspnea (Exercise) 2    Symptoms SOB    Duration Progress to 30 minutes of  aerobic without signs/symptoms of physical distress    Intensity THRR unchanged      Progression   Progression Continue to  progress workloads to maintain intensity without signs/symptoms of physical distress.    Average METs 2.9      Resistance Training   Training Prescription Yes    Weight 3 lb    Reps 10-15      Interval Training   Interval Training No      NuStep   Level 3    Minutes 30    METs 2.9           Nutrition:  Target Goals: Understanding of nutrition guidelines, daily intake of sodium 1500mg , cholesterol 200mg , calories 30% from fat and 7% or less from saturated fats, daily to have 5 or more servings of fruits and vegetables.  Education: All About Nutrition: -Group instruction provided by verbal, written material, interactive activities, discussions, models, and posters to present general guidelines for heart healthy nutrition including fat, fiber, MyPlate, the role of sodium in heart healthy nutrition, utilization of the nutrition label, and utilization of this knowledge for meal planning. Follow up email sent as well. Written material given at graduation.   Biometrics:  Pre Biometrics - 05/20/20 1214      Pre Biometrics   Height 5' 6.1" (1.679 m)    Weight 155 lb 14.4 oz (70.7 kg)    BMI (Calculated) 25.09    Single Leg Stand 30 seconds            Nutrition Therapy Plan and Nutrition Goals:  Nutrition Therapy & Goals - 05/27/20 1214      Personal Nutrition Goals   Comments Pt would not like to meet with dietitian           Nutrition Assessments:  MEDIFICTS Score Key:  ?70 Need to make dietary changes   40-70 Heart Healthy Diet  ? 40 Therapeutic Level Cholesterol Diet  Flowsheet Row Pulmonary Rehab from 05/20/2020 in [facility] Cardiac and Pulmonary Rehab  Picture Your Plate Total Score on Admission 57     Picture Your Plate Scores:  <40 Unhealthy dietary pattern with much room for improvement.  41-50 Dietary pattern unlikely to meet recommendations for good health and room for improvement.  51-60 More healthful dietary pattern, with some room for  improvement.   >60 Healthy dietary pattern, although there may  be some specific behaviors that could be improved.   Nutrition Goals Re-Evaluation:   Nutrition Goals Discharge (Final Nutrition Goals Re-Evaluation):   Psychosocial: Target Goals: Acknowledge presence or absence of significant depression and/or stress, maximize coping skills, provide positive support system. Participant is able to verbalize types and ability to use techniques and skills needed for reducing stress and depression.   Education: Stress, Anxiety, and Depression - Group verbal and visual presentation to define topics covered.  Reviews how body is impacted by stress, anxiety, and depression.  Also discusses healthy ways to reduce stress and to treat/manage anxiety and depression.  Written material given at graduation. Flowsheet Row Pulmonary Rehab from 05/20/2020 in [facility] Cardiac and Pulmonary Rehab  Education need identified 05/20/20      Education: Sleep Hygiene -Provides group verbal and written instruction about how sleep can affect your health.  Define sleep hygiene, discuss sleep cycles and impact of sleep habits. Review good sleep hygiene tips.    Initial Review & Psychosocial Screening:  Initial Psych Review & Screening - 05/17/20 1119      Initial Review   Current issues with Current Stress Concerns    Source of Stress Concerns Unable to perform yard/household activities      Family Dynamics   Good Support System? Yes   wife, large family- son & daughter their children, friends     Barriers   Psychosocial barriers to participate in program There are no identifiable barriers or psychosocial needs.      Screening Interventions   Interventions Encouraged to exercise    Expected Outcomes Short Term goal: Utilizing psychosocial counselor, staff and physician to assist with identification of specific Stressors or current issues interfering with healing process. Setting desired goal for each stressor or  current issue identified.;Long Term Goal: Stressors or current issues are controlled or eliminated.;Short Term goal: Identification and review with participant of any Quality of Life or Depression concerns found by scoring the questionnaire.;Long Term goal: The participant improves quality of Life and PHQ9 Scores as seen by post scores and/or verbalization of changes           Quality of Life Scores:  Scores of 19 and below usually indicate a poorer quality of life in these areas.  A difference of  2-3 points is a clinically meaningful difference.  A difference of 2-3 points in the total score of the Quality of Life Index has been associated with significant improvement in overall quality of life, self-image, physical symptoms, and general health in studies assessing change in quality of life.  PHQ-9: Recent Review Flowsheet Data    Depression screen [facility] 2/9 05/20/2020   Decreased Interest 1   Down, Depressed, Hopeless 1   PHQ - 2 Score 2   Altered sleeping 2   Tired, decreased energy 3   Change in appetite 0   Feeling bad or failure about yourself  3   Trouble concentrating 1   Moving slowly or fidgety/restless 2   Suicidal thoughts 1   PHQ-9 Score 14   Difficult doing work/chores Somewhat difficult     Interpretation of Total Score  Total Score Depression Severity:  1-4 = Minimal depression, 5-9 = Mild depression, 10-14 = Moderate depression, 15-19 = Moderately severe depression, 20-27 = Severe depression   Psychosocial Evaluation and Intervention:  Psychosocial Evaluation - 05/17/20 1142      Psychosocial Evaluation & Interventions   Interventions Encouraged to exercise with the program and follow exercise prescription    Comments  Ham [place] He has a great support system with his wife, son and daughter and their families. He also has friends around that he feels would help if needed. HIs goal is to improve his shortness of  breath, it is keeping him from his daily activites. His wife has been heklping with the chores that Ham cannot complete. She has had to help with the yardwork. He can ride lawnmower, and she helps with the trimming. He wants to be able to manage his chores without shortness of breath. He is hoping this program will assist him in making the goal. He should do well in the program, he has no barriers to attending.    Expected Outcomes STG: Ham attends all scheduled sessions, his Shortness of breath improves enough to allow him to manage his chores.  LTG: Ham continues to utilize the resources and tools he received during the program and he continues to see improvement with his shortness of breath    Continue Psychosocial Services  Follow up required by staff           Psychosocial Re-Evaluation:   Psychosocial Discharge (Final Psychosocial Re-Evaluation):   Education: Education Goals: Education classes will be provided on a weekly basis, covering required topics. Participant will state understanding/return demonstration of topics presented.  Learning Barriers/Preferences:  Learning Barriers/Preferences - 05/17/20 1121      Learning Barriers/Preferences   Learning Barriers None    Learning Preferences None           General Pulmonary Education Topics:  Infection Prevention: - Provides verbal and written material to individual with discussion of infection control including proper hand washing and proper equipment cleaning during exercise session. Flowsheet Row Pulmonary Rehab from 05/20/2020 in [facility]monary Rehab  Date 05/20/20  Educator [facility]  Instruction Review Code 1- Verbalizes Understanding      Falls Prevention: - Provides verbal and written material to individual with discussion of falls prevention and safety. Flowsheet Row Pulmonary Rehab from 05/20/2020 in [facility] Cardiac and Pulmonary Rehab  Date 05/17/20  Educator SB  Instruction Review Code 1- Verbalizes  Understanding      Chronic Lung Disease Review: - Group verbal instruction with posters, models, PowerPoint presentations and videos,  to review new updates, new respiratory medications, new advancements in procedures and treatments. Providing information on websites and "800" numbers for continued self-education. Includes information about supplement oxygen, available portable oxygen systems, continuous and intermittent flow rates, oxygen safety, concentrators, and Medicare reimbursement for oxygen. Explanation of Pulmonary Drugs, including class, frequency, complications, importance of spacers, rinsing mouth after steroid MDI's, and proper cleaning methods for nebulizers. Review of basic lung anatomy and physiology related to function, structure, and complications of lung disease. Review of risk factors. Discussion about methods for diagnosing sleep apnea and types of masks and machines for OSA. Includes a review of the use of types of environmental controls: home humidity, furnaces, filters, dust mite/pet prevention, HEPA vacuums. Discussion about weather changes, air quality and the benefits of nasal washing. Instruction on Warning signs, infection symptoms, calling MD promptly, preventive modes, and value of vaccinations. Review of effective airway clearance, coughing and/or vibration techniques. Emphasizing that all should Create an Action Plan. Written material given at graduation. Flowsheet Row Pulmonary Rehab from 05/20/2020 in [facility] Cardiac and Pulmonary Rehab  Education need identified 05/20/20      AED/CPR: - Group verbal and written instruction with the use of models to  demonstrate the basic use of the AED with the basic ABC's of resuscitation.    Anatomy and Cardiac Procedures: - Group verbal and visual presentation and models provide information about basic cardiac anatomy and function. Reviews the testing methods done to diagnose heart disease and the outcomes of the test results.  Describes the treatment choices: Medical Management, Angioplasty, or Coronary Bypass Surgery for treating various heart conditions including Myocardial Infarction, Angina, Valve Disease, and Cardiac Arrhythmias.  Written material given at graduation.   Medication Safety: - Group verbal and visual instruction to review commonly prescribed medications for heart and lung disease. Reviews the medication, class of the drug, and side effects. Includes the steps to properly store meds and maintain the prescription regimen.  Written material given at graduation.   Other: -Provides group and verbal instruction on various topics (see comments)   Knowledge Questionnaire Score:  Knowledge Questionnaire Score - 05/20/20 1215      Knowledge Questionnaire Score   Pre Score 15/18 Education Focus: O2 safety            Core Components/Risk Factors/Patient Goals at Admission:  Personal Goals and Risk Factors at Admission - 05/20/20 1215      Core Components/Risk Factors/Patient Goals on Admission    Weight Management Yes;Weight Maintenance    Intervention Weight Management: Develop a combined nutrition and exercise program designed to reach desired caloric intake, while maintaining appropriate intake of nutrient and fiber, sodium and fats, and appropriate energy expenditure required for the weight goal.;Weight Management: Provide education and appropriate resources to help participant work on and attain dietary goals.    Admit Weight 155 lb 14.4 oz (70.7 kg)    Goal Weight: Short Term 152 lb (68.9 kg)    Goal Weight: Long Term 152 lb (68.9 kg)    Expected Outcomes Short Term: Continue to assess and modify interventions until short term weight is achieved;Long Term: Adherence to nutrition and physical activity/exercise program aimed toward attainment of established weight goal;Weight Maintenance: Understanding of the daily nutrition guidelines, which includes 25-35% calories from fat, 7% or less cal from  saturated fats, less than 200mg  cholesterol, less than 1.5gm of sodium, & 5 or more servings of fruits and vegetables daily    Hypertension Yes    Intervention Provide education on lifestyle modifcations including regular physical activity/exercise, weight management, moderate sodium restriction and increased consumption of fresh fruit, vegetables, and low fat dairy, alcohol moderation, and smoking cessation.;Monitor prescription use compliance.    Expected Outcomes Short Term: Continued assessment and intervention until BP is < 140/64mm HG in hypertensive participants. < 130/12mm HG in hypertensive participants with diabetes, heart failure or chronic kidney disease.;Long Term: Maintenance of blood pressure at goal levels.    Lipids Yes    Intervention Provide education and support for participant on nutrition & aerobic/resistive exercise along with prescribed medications to achieve LDL 70mg , HDL >40mg .    Expected Outcomes Short Term: Participant states understanding of desired cholesterol values and is compliant with medications prescribed. Participant is following exercise prescription and nutrition guidelines.;Long Term: Cholesterol controlled with medications as prescribed, with individualized exercise RX and with personalized nutrition plan. Value goals: LDL < 70mg , HDL > 40 mg.           Education:Diabetes - Individual verbal and written instruction to review signs/symptoms of diabetes, desired ranges of glucose level fasting, after meals and with exercise. Acknowledge that pre and post exercise glucose checks will be done for 3 sessions at entry of program.  Know Your Numbers and Heart Failure: - Group verbal and visual instruction to discuss disease risk factors for cardiac and pulmonary disease and treatment options.  Reviews associated critical values for Overweight/Obesity, Hypertension, Cholesterol, and Diabetes.  Discusses basics of heart failure: signs/symptoms and treatments.   Introduces Heart Failure Zone chart for action plan for heart failure.  Written material given at graduation.   Core Components/Risk Factors/Patient Goals Review:    Core Components/Risk Factors/Patient Goals at Discharge (Final Review):    ITP Comments:  ITP Comments    Row Name 05/17/20 1141 05/20/20 1201 05/25/20 1139 06/16/20 0645 06/23/20 1437   ITP Comments Virtual orientation call completed today. he has an appointment on 05/20/2020 for EP eval and gym Orientation.  Documentation of diagnosis can be found in [facility] 11/3 and [id number] Completed and gym orientation. Initial ITP created and sent for review to Dr. Bethann Punches, Medical Director. First full day of exercise!  Patient was oriented to gym and equipment including functions, settings, policies, and procedures.  Patient's individual exercise prescription and treatment plan were reviewed.  All starting workloads were established based on the results of the 6 minute walk test done at initial orientation visit.  The plan for exercise progression was also introduced and progression will be customized based on patient's performance and goals. 30 Day review completed. Medical Director ITP review done, changes made as directed, and signed approval by Medical Director. Ham has been out with family, MD appts, muscle biopsy.  He is currently awaiting clearance to return after his biopsy.   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

## 2020-06-24 NOTE — Progress Notes (Signed)
Discharge Progress Report  Patient Details  Name: Charles Davila MRN: [id number] Date of Birth: May 24, 1940 Referring Provider:   Doristine Devoid Pulmonary Rehab from 05/20/2020 in [facility] Cardiac and Pulmonary Rehab  Referring Provider Daniel Nones MD  [Pulmonologist: Dr. Rudi Heap Aleskerov]       Number of Visits: 6  Reason for Discharge:  Early Exit:  Personal  Smoking History:  Social History   Tobacco Use  Smoking Status Former Smoker  . Packs/day: 2.00  . Years: 10.00  . Pack years: 20.00  . Types: Cigarettes  . Quit date: 04/24/1970  . Years since quitting: 50.2  Smokeless Tobacco Never Used    Diagnosis:  Dyspnea due to COVID-19  ADL UCSD:  Pulmonary Assessment Scores    Row Name 05/20/20 1218         ADL UCSD   ADL Phase Entry     SOB Score total 67     Rest 1     Walk 3     Stairs 4     Bath 3     Dress 3     Shop 3           CAT Score   CAT Score 22           mMRC Score   mMRC Score 3            Initial Exercise Prescription:  Initial Exercise Prescription - 05/20/20 1200      Date of Initial Exercise RX and Referring Provider   Date 05/20/20    Referring Provider Daniel Nones MD   Pulmonologist: Dr. Amanda Cockayne     Treadmill   MPH 2    Grade 0.5    Minutes 15    METs 2.67      Recumbant Bike   Level 2    RPM 50    Watts 22    Minutes 15    METs 2      NuStep   Level 2    SPM 80    Minutes 15    METs 2      Prescription Details   Frequency (times per week) 2    Duration Progress to 30 minutes of continuous aerobic without signs/symptoms of physical distress      Intensity   THRR 40-80% of Max Heartrate 112-133    Ratings of Perceived Exertion 11-13    Perceived Dyspnea 0-4      Progression   Progression Continue to progress workloads to maintain intensity without signs/symptoms of physical distress.      Resistance Training   Training Prescription Yes    Weight 3 lb    Reps 10-15           Discharge  Exercise Prescription (Final Exercise Prescription Changes):  Exercise Prescription Changes - 06/23/20 1400      Response to Exercise   Blood Pressure (Admit) 134/68    Blood Pressure (Exercise) 150/74    Blood Pressure (Exit) 132/62    Heart Rate (Admit) 101 bpm    Heart Rate (Exercise) 106 bpm    Heart Rate (Exit) 103 bpm    Oxygen Saturation (Admit) 93 %    Oxygen Saturation (Exercise) 90 %    Oxygen Saturation (Exit) 93 %    Rating of Perceived Exertion (Exercise) 12    Perceived Dyspnea (Exercise) 2    Symptoms SOB    Duration Progress to 30 minutes of  aerobic without signs/symptoms of physical distress  Intensity THRR unchanged      Progression   Progression Continue to progress workloads to maintain intensity without signs/symptoms of physical distress.    Average METs 2.9      Resistance Training   Training Prescription Yes    Weight 3 lb    Reps 10-15      Interval Training   Interval Training No      NuStep   Level 3    Minutes 30    METs 2.9           Functional Capacity:  6 Minute Walk    Row Name 05/20/20 1202         6 Minute Walk   Phase Initial     Distance 1195 feet     Walk Time 5.5 minutes     # of Rest Breaks 0  test stopped due to desaturation     MPH 2.46     METS 2.97     RPE 13     Perceived Dyspnea  3     VO2 Peak 10.41     Symptoms Yes (comment)     Comments SOB     Resting HR 92 bpm     Resting BP 146/84     Resting Oxygen Saturation  94 %     Exercise Oxygen Saturation  during 6 min walk 78 %     Max Ex. HR 117 bpm     Max Ex. BP 172/74     2 Minute Post BP 156/74           Interval HR   1 Minute HR 104     2 Minute HR 108     3 Minute HR 108     4 Minute HR 112     5 Minute HR 110     6 Minute HR 117     2 Minute Post HR 94     Interval Heart Rate? Yes           Interval Oxygen   Interval Oxygen? Yes     Baseline Oxygen Saturation % 94 %     1 Minute Oxygen Saturation % 90 %     1 Minute Liters of Oxygen  0 L  Room Air     2 Minute Oxygen Saturation % 86 %     2 Minute Liters of Oxygen 0 L     3 Minute Oxygen Saturation % 85 %     3 Minute Liters of Oxygen 0 L     4 Minute Oxygen Saturation % 81 %     4 Minute Liters of Oxygen 0 L     5 Minute Oxygen Saturation % 81 %     5 Minute Liters of Oxygen 0 L     6 Minute Oxygen Saturation % 79 %  test stopped at 5:30 lowest drop to 78% at 6:22     6 Minute Liters of Oxygen 0 L     2 Minute Post Oxygen Saturation % 81 %  recheck at 9:42 93%     2 Minute Post Liters of Oxygen 0 L            Psychological, QOL, Others - Outcomes: PHQ 2/9: Depression screen PHQ 2/9 05/20/2020  Decreased Interest 1  Down, Depressed, Hopeless 1  PHQ - 2 Score 2  Altered sleeping 2  Tired, decreased energy 3  Change in appetite 0  Feeling bad or failure about  yourself  3  Trouble concentrating 1  Moving slowly or fidgety/restless 2  Suicidal thoughts 1  PHQ-9 Score 14  Difficult doing work/chores Somewhat difficult    Quality of Life:    Nutrition & Weight - Outcomes:  Pre Biometrics - 05/20/20 1214      Pre Biometrics   Height 5' 6.1" (1.679 m)    Weight 155 lb 14.4 oz (70.7 kg)    BMI (Calculated) 25.09    Single Leg Stand 30 seconds            Nutrition:  Nutrition Therapy & Goals - 05/27/20 1214      Personal Nutrition Goals   Comments Pt would not like to meet with dietitian           Nutrition Discharge:   Education Questionnaire Score:  Knowledge Questionnaire Score - 05/20/20 1215      Knowledge Questionnaire Score   Pre Score 15/18 Education Focus: O2 safety           Goals reviewed with patient; copy given to patient.

## 2020-06-25 ENCOUNTER — Other Ambulatory Visit: Payer: Self-pay | Admitting: Rheumatology

## 2020-06-25 DIAGNOSIS — M3320 Polymyositis, organ involvement unspecified: Secondary | ICD-10-CM

## 2020-06-25 DIAGNOSIS — J849 Interstitial pulmonary disease, unspecified: Secondary | ICD-10-CM

## 2020-06-25 DIAGNOSIS — R634 Abnormal weight loss: Secondary | ICD-10-CM

## 2020-07-02 ENCOUNTER — Ambulatory Visit
Admission: RE | Admit: 2020-07-02 | Discharge: 2020-07-02 | Disposition: A | Discharge status: Home / Self Care | Payer: Medicare Other | Source: Ambulatory Visit | Attending: Rheumatology | Admitting: Rheumatology

## 2020-07-02 ENCOUNTER — Other Ambulatory Visit: Payer: Self-pay

## 2020-07-02 DIAGNOSIS — J849 Interstitial pulmonary disease, unspecified: Secondary | ICD-10-CM | POA: Diagnosis present

## 2020-07-02 DIAGNOSIS — R634 Abnormal weight loss: Secondary | ICD-10-CM | POA: Insufficient documentation

## 2020-07-02 DIAGNOSIS — M3320 Polymyositis, organ involvement unspecified: Secondary | ICD-10-CM | POA: Diagnosis not present

## 2020-07-02 DIAGNOSIS — M359 Systemic involvement of connective tissue, unspecified: Secondary | ICD-10-CM | POA: Insufficient documentation

## 2020-07-02 MED ORDER — IOHEXOL 300 MG/ML  SOLN
85.0000 mL | Freq: Once | INTRAMUSCULAR | Status: AC | PRN
  Administered 2020-07-02: 85 mL via INTRAVENOUS

## 2020-07-20 ENCOUNTER — Other Ambulatory Visit: Payer: Self-pay | Admitting: Pulmonary Disease

## 2020-07-20 ENCOUNTER — Other Ambulatory Visit (HOSPITAL_COMMUNITY): Payer: Self-pay | Admitting: Pulmonary Disease

## 2020-07-20 DIAGNOSIS — R0609 Other forms of dyspnea: Secondary | ICD-10-CM

## 2020-07-20 DIAGNOSIS — R0600 Dyspnea, unspecified: Secondary | ICD-10-CM

## 2020-07-20 DIAGNOSIS — J849 Interstitial pulmonary disease, unspecified: Secondary | ICD-10-CM

## 2020-10-14 ENCOUNTER — Other Ambulatory Visit: Payer: Self-pay

## 2020-10-14 ENCOUNTER — Ambulatory Visit
Admission: RE | Admit: 2020-10-14 | Discharge: 2020-10-14 | Disposition: A | Discharge status: Home / Self Care | Payer: Medicare Other | Source: Ambulatory Visit | Attending: Pulmonary Disease | Admitting: Pulmonary Disease

## 2020-10-14 DIAGNOSIS — R0609 Other forms of dyspnea: Secondary | ICD-10-CM

## 2020-10-14 DIAGNOSIS — J849 Interstitial pulmonary disease, unspecified: Secondary | ICD-10-CM | POA: Diagnosis present

## 2020-10-14 DIAGNOSIS — R0600 Dyspnea, unspecified: Secondary | ICD-10-CM | POA: Diagnosis present

## 2021-02-17 ENCOUNTER — Other Ambulatory Visit: Payer: Self-pay | Admitting: Pulmonary Disease

## 2021-02-17 DIAGNOSIS — J849 Interstitial pulmonary disease, unspecified: Secondary | ICD-10-CM

## 2021-05-12 ENCOUNTER — Ambulatory Visit: Payer: Medicare Other

## 2021-07-14 ENCOUNTER — Ambulatory Visit
Admission: RE | Admit: 2021-07-14 | Discharge: 2021-07-14 | Disposition: A | Discharge status: Home / Self Care | Payer: Medicare Other | Source: Ambulatory Visit | Attending: Pulmonary Disease | Admitting: Pulmonary Disease

## 2021-07-14 ENCOUNTER — Other Ambulatory Visit: Payer: Self-pay | Admitting: Pulmonary Disease

## 2021-07-14 ENCOUNTER — Other Ambulatory Visit: Payer: Self-pay

## 2021-07-14 DIAGNOSIS — J849 Interstitial pulmonary disease, unspecified: Secondary | ICD-10-CM | POA: Insufficient documentation

## 2021-07-27 ENCOUNTER — Ambulatory Visit: Payer: Medicare Other

## 2022-04-19 ENCOUNTER — Other Ambulatory Visit: Payer: Self-pay | Admitting: Family Medicine

## 2022-04-19 ENCOUNTER — Ambulatory Visit
Admission: RE | Admit: 2022-04-19 | Discharge: 2022-04-19 | Disposition: A | Discharge status: Home / Self Care | Payer: Medicare Other | Source: Ambulatory Visit | Attending: Family Medicine | Admitting: Family Medicine

## 2022-04-19 DIAGNOSIS — R42 Dizziness and giddiness: Secondary | ICD-10-CM | POA: Diagnosis not present

## 2022-04-19 DIAGNOSIS — S0990XA Unspecified injury of head, initial encounter: Secondary | ICD-10-CM | POA: Diagnosis present

## 2022-04-19 DIAGNOSIS — M542 Cervicalgia: Secondary | ICD-10-CM | POA: Diagnosis not present

## 2022-04-19 DIAGNOSIS — Y92009 Unspecified place in unspecified non-institutional (private) residence as the place of occurrence of the external cause: Secondary | ICD-10-CM

## 2022-04-19 DIAGNOSIS — W19XXXA Unspecified fall, initial encounter: Secondary | ICD-10-CM | POA: Diagnosis not present

## 2022-08-25 IMAGING — CT CT CHEST HIGH RESOLUTION
1 of 3 series · 13 of 33 positions shown, 17 images · non-contrast
Comparison: Chest CT 10/14/2020.

CLINICAL DATA: 80-year-old male with history of shortness of breath
for 1 year.



[Series 2: thorax · axial · 0.75mm/px · z∈[-650,-372]mm · 13 of 153 slices shown, 17 images]
[im 7/153  mediastinal]
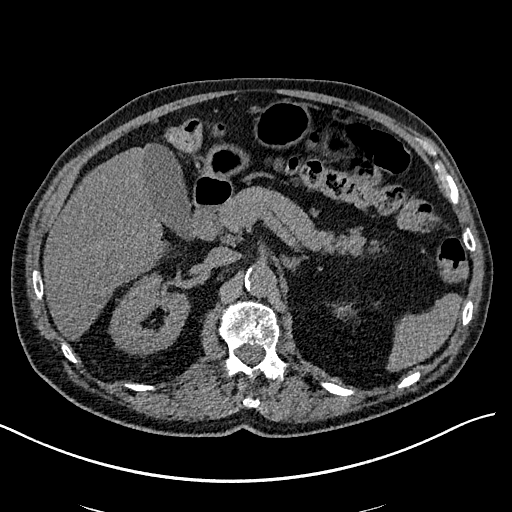
[im 7/153  lung]
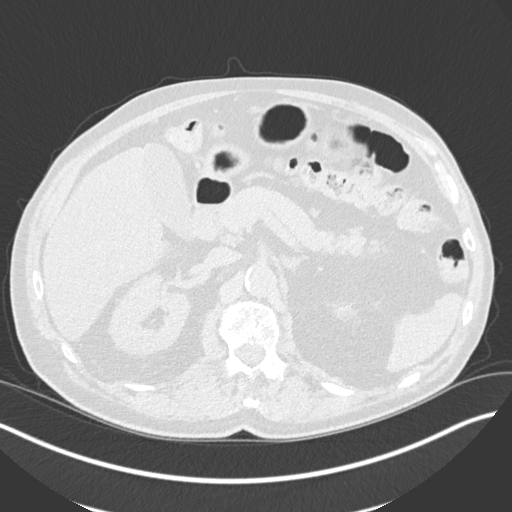
[im 20/153  lung]
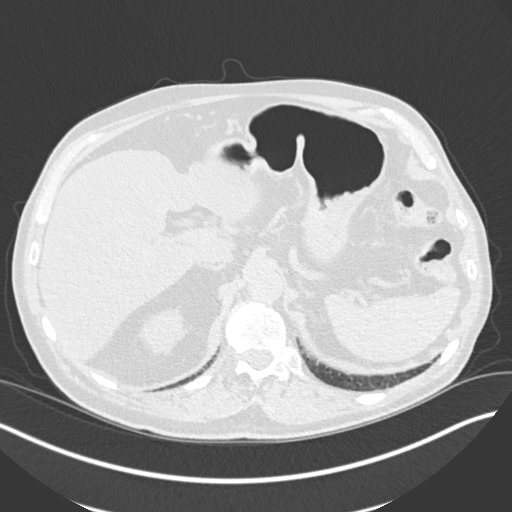
[im 34/153  lung]
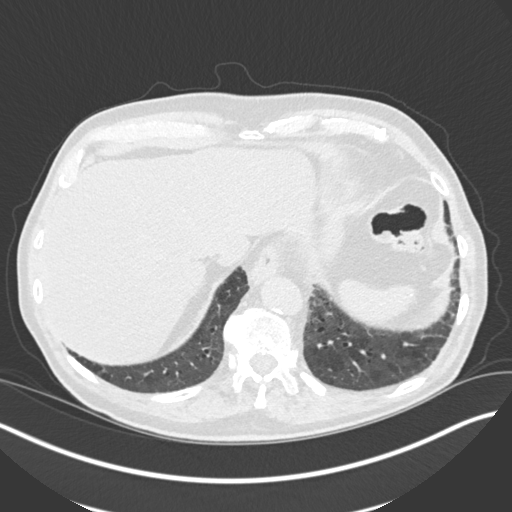
[im 47/153  lung]
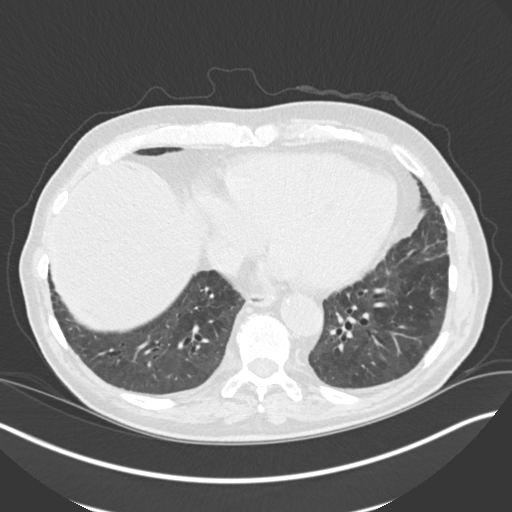
[im 53/153  mediastinal]
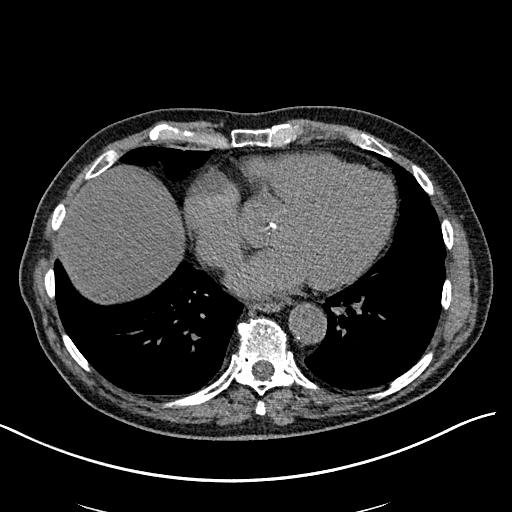
[im 53/153  lung]
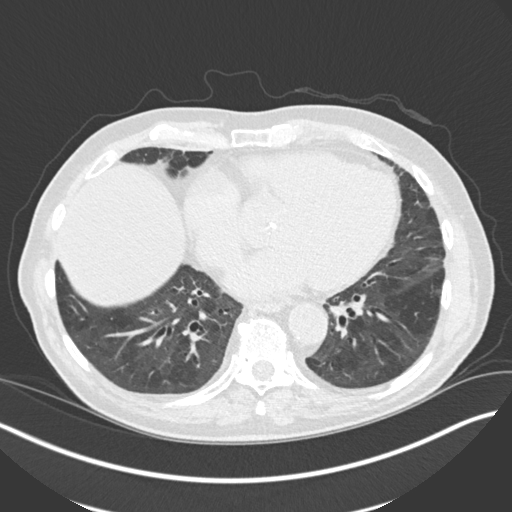
[im 67/153  lung]
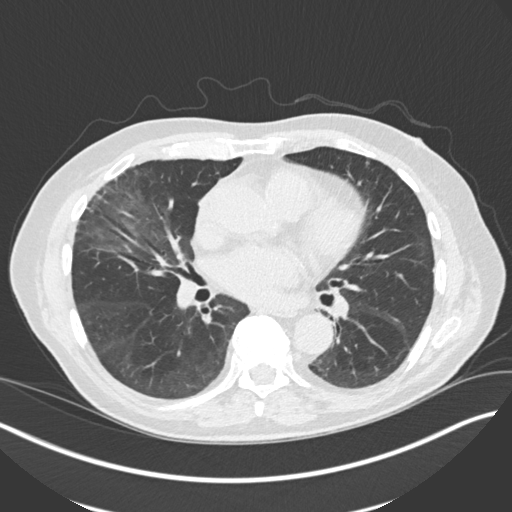
[im 73/153  lung]
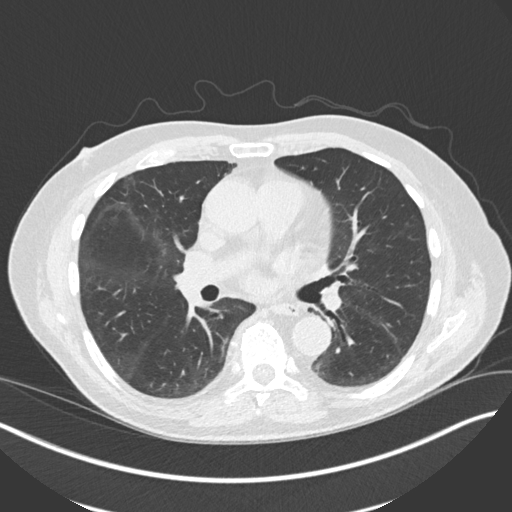
[im 86/153  lung]
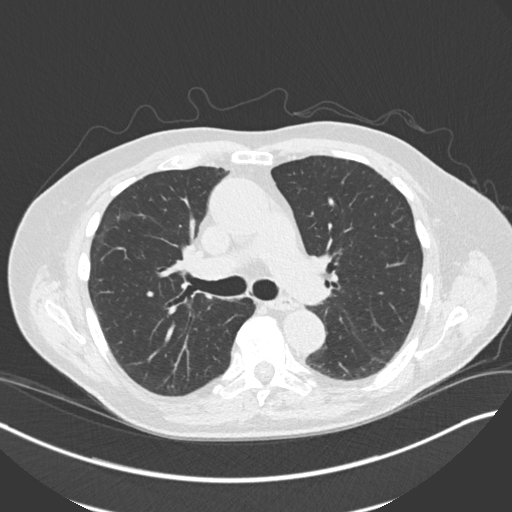
[im 100/153  mediastinal]
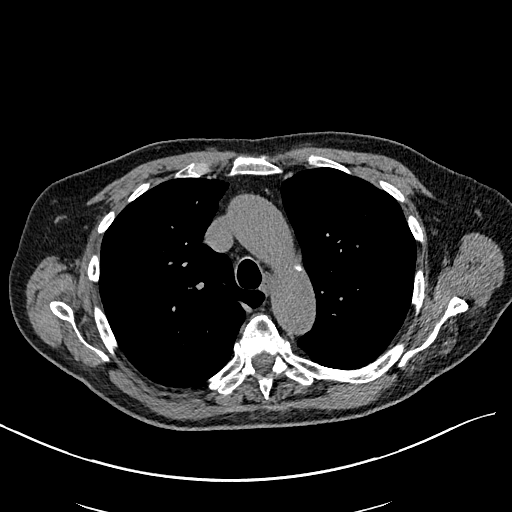
[im 100/153  lung]
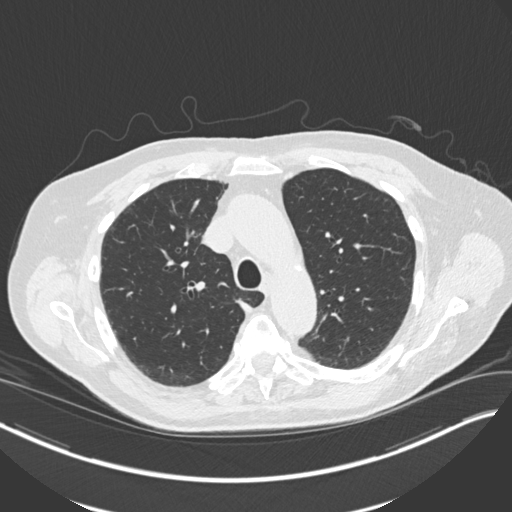
[im 106/153  lung]
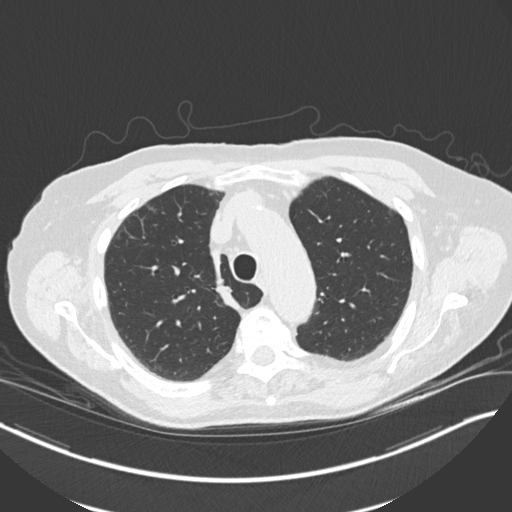
[im 119/153  lung]
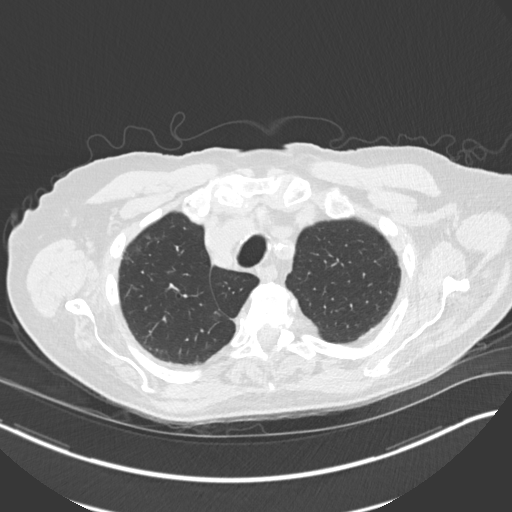
[im 133/153  lung]
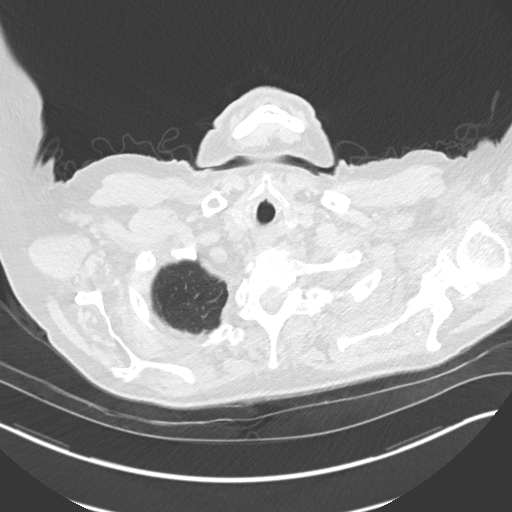
[im 146/153  mediastinal]
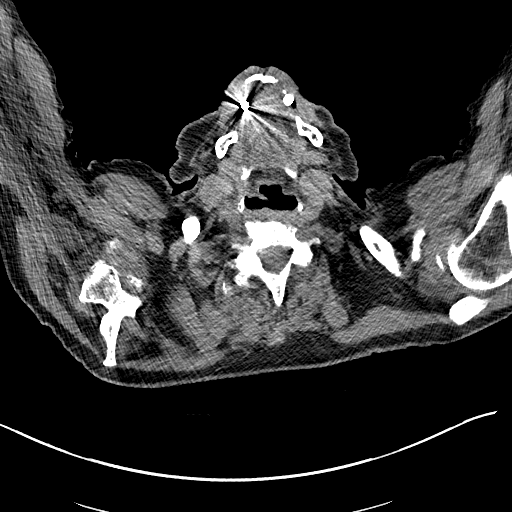
[im 146/153  lung]
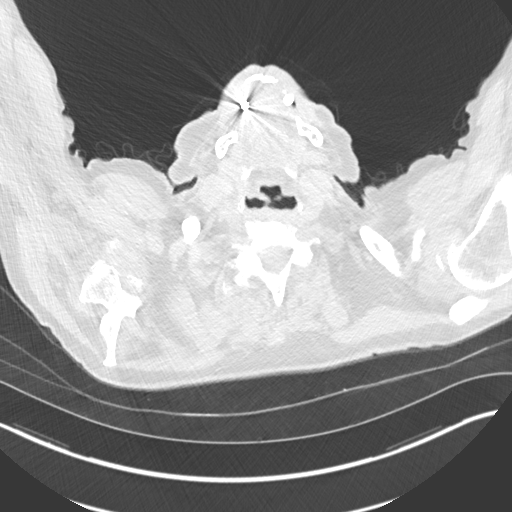

[13 of 33 positions shown; findings below may reference images not displayed]

FINDINGS: Cardiovascular: Heart size is normal. There is no significant
pericardial fluid, thickening or pericardial calcification. There is
aortic atherosclerosis, as well as atherosclerosis of the great
vessels of the mediastinum and the coronary arteries, including
calcified atherosclerotic plaque in the left main, left anterior
descending and left circumflex coronary arteries. Calcifications of
the aortic valve. Ectasia of the ascending thoracic aorta (4.1 cm in
diameter).

Mediastinum/Nodes: No pathologically enlarged mediastinal or hilar
lymph nodes. Please note that accurate exclusion of hilar adenopathy
is limited on noncontrast CT scans. Esophagus is unremarkable in
appearance. No axillary lymphadenopathy.

Lungs/Pleura: Azygous lobe (normal anatomical variant) incidentally
noted. High-resolution images again demonstrate widespread areas of
ground-glass attenuation, mild septal thickening and mild subpleural
reticulation, cylindrical bronchiectasis and mild peripheral
bronchiolectasis scattered throughout the lungs bilaterally.
Findings have a definitive craniocaudal gradient and do not appear
progressive compared to the prior study. No honeycombing.
Inspiratory and expiratory imaging demonstrates minimal air trapping
indicative of mild small airways disease. No acute consolidative
airspace disease. No pleural effusions. No suspicious appearing
pulmonary nodules or masses are noted.

Upper Abdomen: Aortic atherosclerosis. Severe diffuse low
attenuation throughout the visualized hepatic parenchyma, indicative
of hepatic steatosis.

Musculoskeletal: There are no aggressive appearing lytic or blastic
lesions noted in the visualized portions of the skeleton.
IMPRESSION: 1. The appearance of the lungs is compatible with interstitial lung
disease, with a spectrum of findings technically categorized as
probable usual interstitial pneumonia (UIP) per current ATS
guidelines. However, given the stability compared to the prior
study, an alternative diagnosis such as fibrotic phase nonspecific
interstitial pneumonia (NSIP) should be considered.
2. Aortic atherosclerosis, in addition to left main and 2 vessel
coronary artery disease. Assessment for potential risk factor
modification, dietary therapy or pharmacologic therapy may be
warranted, if clinically indicated.
3. There are calcifications of the aortic valve. Echocardiographic
correlation for evaluation of potential valvular dysfunction may be
warranted if clinically indicated.
4. Ectasia of ascending thoracic aorta (4.1 cm in diameter).
Recommend annual imaging followup by CTA or MRA. This recommendation
follows 6424 ACCF/AHA/AATS/ACR/ASA/SCA/SAMSONAITE/ANRI/WOLFE/NOMASIBULELE Guidelines
for the Diagnosis and Management of Patients with Thoracic Aortic
Disease. Circulation. 6424; 121: [id number]. Aortic aneurysm NOS
(RII4L-JXW.V).

Aortic Atherosclerosis (RII4L-M0L.L).

## 2023-02-02 ENCOUNTER — Other Ambulatory Visit: Payer: Self-pay | Admitting: Physician Assistant

## 2023-02-02 ENCOUNTER — Ambulatory Visit
Admission: RE | Admit: 2023-02-02 | Discharge: 2023-02-02 | Disposition: A | Discharge status: Home / Self Care | Payer: Medicare Other | Source: Ambulatory Visit | Attending: Physician Assistant | Admitting: Physician Assistant

## 2023-02-02 DIAGNOSIS — R1032 Left lower quadrant pain: Secondary | ICD-10-CM | POA: Insufficient documentation

## 2023-02-02 DIAGNOSIS — D72829 Elevated white blood cell count, unspecified: Secondary | ICD-10-CM | POA: Insufficient documentation

## 2023-02-02 MED ORDER — IOHEXOL 300 MG/ML  SOLN
100.0000 mL | Freq: Once | INTRAMUSCULAR | Status: AC | PRN
  Administered 2023-02-02: 100 mL via INTRAVENOUS

## 2023-02-05 ENCOUNTER — Other Ambulatory Visit: Payer: Self-pay | Admitting: Physician Assistant

## 2023-02-05 DIAGNOSIS — D72829 Elevated white blood cell count, unspecified: Secondary | ICD-10-CM

## 2023-02-05 DIAGNOSIS — R1032 Left lower quadrant pain: Secondary | ICD-10-CM

## 2023-03-15 ENCOUNTER — Encounter: Payer: Self-pay | Admitting: Ophthalmology

## 2023-03-19 NOTE — Discharge Instructions (Signed)

## 2023-03-26 ENCOUNTER — Ambulatory Visit
Admission: RE | Admit: 2023-03-26 | Discharge: 2023-03-26 | Disposition: A | Discharge status: Home / Self Care | Payer: Medicare Other | Attending: Ophthalmology | Admitting: Ophthalmology

## 2023-03-26 ENCOUNTER — Encounter
Admission: RE | Disposition: A | Discharge status: Home / Self Care | Payer: Self-pay | Source: Home / Self Care | Attending: Ophthalmology

## 2023-03-26 ENCOUNTER — Other Ambulatory Visit: Payer: Self-pay

## 2023-03-26 ENCOUNTER — Ambulatory Visit: Payer: Medicare Other | Admitting: Anesthesiology

## 2023-03-26 ENCOUNTER — Encounter: Payer: Self-pay | Admitting: Ophthalmology

## 2023-03-26 DIAGNOSIS — Z87891 Personal history of nicotine dependence: Secondary | ICD-10-CM | POA: Diagnosis not present

## 2023-03-26 DIAGNOSIS — I10 Essential (primary) hypertension: Secondary | ICD-10-CM | POA: Diagnosis not present

## 2023-03-26 DIAGNOSIS — Z79899 Other long term (current) drug therapy: Secondary | ICD-10-CM | POA: Insufficient documentation

## 2023-03-26 DIAGNOSIS — H2511 Age-related nuclear cataract, right eye: Secondary | ICD-10-CM | POA: Diagnosis present

## 2023-03-26 HISTORY — DX: Unspecified hearing loss, unspecified ear: H91.90

## 2023-03-26 HISTORY — PX: CATARACT EXTRACTION W/PHACO: SHX586

## 2023-03-26 HISTORY — DX: Polymyositis, organ involvement unspecified: M33.20

## 2023-03-26 SURGERY — PHACOEMULSIFICATION, CATARACT, WITH IOL INSERTION
Anesthesia: Monitor Anesthesia Care | Site: Eye | Laterality: Right

## 2023-03-26 MED ORDER — SODIUM CHLORIDE 0.9% FLUSH: 10.0000 mL | Freq: Two times a day (BID) | INTRAVENOUS | Status: DC

## 2023-03-26 MED ORDER — MIDAZOLAM HCL 2 MG/2ML IJ SOLN
INTRAMUSCULAR | Status: AC
Start: 2023-03-26 — End: ?
  Filled 2023-03-26: qty 2

## 2023-03-26 MED ORDER — SIGHTPATH DOSE#1 NA HYALUR & NA CHOND-NA HYALUR IO KIT
PACK | INTRAOCULAR | Status: DC | PRN
  Administered 2023-03-26: 1 via OPHTHALMIC

## 2023-03-26 MED ORDER — MIDAZOLAM HCL 2 MG/2ML IJ SOLN
INTRAMUSCULAR | Status: DC | PRN
  Administered 2023-03-26: 2 mg via INTRAVENOUS

## 2023-03-26 MED ORDER — MOXIFLOXACIN HCL 0.5 % OP SOLN
OPHTHALMIC | Status: DC | PRN
  Administered 2023-03-26: .2 mL via OPHTHALMIC

## 2023-03-26 MED ORDER — ARMC OPHTHALMIC DILATING DROPS
1.0000 | OPHTHALMIC | Status: DC | PRN
  Administered 2023-03-26 (×3): 1 via OPHTHALMIC

## 2023-03-26 MED ORDER — SIGHTPATH DOSE#1 BSS IO SOLN
INTRAOCULAR | Status: DC | PRN
  Administered 2023-03-26: 84 mL via OPHTHALMIC

## 2023-03-26 MED ORDER — TETRACAINE HCL 0.5 % OP SOLN
OPHTHALMIC | Status: AC
  Filled 2023-03-26: qty 4

## 2023-03-26 MED ORDER — LIDOCAINE HCL (PF) 2 % IJ SOLN
INTRAOCULAR | Status: DC | PRN
  Administered 2023-03-26: 1 mL via INTRAOCULAR

## 2023-03-26 MED ORDER — TETRACAINE HCL 0.5 % OP SOLN
1.0000 [drp] | OPHTHALMIC | Status: DC | PRN
  Administered 2023-03-26 (×3): 1 [drp] via OPHTHALMIC

## 2023-03-26 MED ORDER — FENTANYL CITRATE (PF) 100 MCG/2ML IJ SOLN
INTRAMUSCULAR | Status: AC
  Filled 2023-03-26: qty 2

## 2023-03-26 MED ORDER — SIGHTPATH DOSE#1 BSS IO SOLN
INTRAOCULAR | Status: DC | PRN
  Administered 2023-03-26: 15 mL

## 2023-03-26 SURGICAL SUPPLY — 11 items
CATARACT SUITE SIGHTPATH (MISCELLANEOUS) ×1
DISSECTOR HYDRO NUCLEUS 50X22 (MISCELLANEOUS) ×1 IMPLANT
FEE CATARACT SUITE SIGHTPATH (MISCELLANEOUS) ×1 IMPLANT
GLOVE PI ULTRA LF STRL 7.5 (GLOVE) ×1 IMPLANT
GLOVE SURG POLYISOPRENE 8.5 (GLOVE) ×1
GLOVE SURG SYN 8.5 PF PI BL (GLOVE) ×1 IMPLANT
LENS IOL TECNIS EYHANCE 18.5 (Intraocular Lens) IMPLANT
NDL FILTER BLUNT 18X1 1/2 (NEEDLE) ×1 IMPLANT
NEEDLE FILTER BLUNT 18X1 1/2 (NEEDLE) ×1
SYR 3ML LL SCALE MARK (SYRINGE) ×1 IMPLANT
SYR 5ML LL (SYRINGE) ×1 IMPLANT

## 2023-03-26 NOTE — H&P (Signed)
[facility]   Primary Care Physician:  Lynnea Ferrier, MD Ophthalmologist: Dr. Willey Blade  Pre-Procedure History & Physical: HPI:  Charles Davila is a 82 y.o. male here for cataract surgery.   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(HCC)     Past Surgical History:  Procedure Laterality Date   APPENDECTOMY     ESOPHAGOGASTRODUODENOSCOPY N/A 04/11/2015   Procedure: ESOPHAGOGASTRODUODENOSCOPY (EGD);  Surgeon: Scot Jun, MD;  Location: [facility] ENDOSCOPY;  Service: Endoscopy;  Laterality: N/A;   ESOPHAGOGASTRODUODENOSCOPY N/A 04/12/2015   Procedure: ESOPHAGOGASTRODUODENOSCOPY (EGD);  Surgeon: Wallace Cullens, MD;  Location: [facility] ENDOSCOPY;  Service: Endoscopy;  Laterality: N/A;    Prior to Admission medications   Medication Sig Start Date End Date Taking? Authorizing Provider  acetaminophen (TYLENOL) 650 MG CR tablet Take 650 mg by mouth every 6 (six) hours as needed for pain.   Yes [provider]  albuterol (VENTOLIN HFA) 108 (90 Base) MCG/ACT inhaler Inhale into the lungs. 02/27/20  Yes [provider]  azaTHIOprine (IMURAN) 50 MG tablet Take 125 mg by mouth daily.   Yes [provider]  Cyanocobalamin (VITAMIN B-12 IJ) Inject as directed every 14 (fourteen) days.   Yes [provider]  ipratropium (ATROVENT HFA) 17 MCG/ACT inhaler Inhale 2 puffs into the lungs every 6 (six) hours.   Yes [provider]  OXYGEN Inhale into the lungs at bedtime as needed.   Yes [provider]  pantoprazole (PROTONIX) 40 MG tablet Take 1 tablet by mouth daily. 09/17/19  Yes [provider]  predniSONE (DELTASONE) 5 MG tablet Take 5 mg by mouth daily with breakfast.   Yes [provider]    Allergies as of 02/19/2023   (No Known Allergies)    History reviewed. No pertinent family history.  Social History   Socioeconomic History   Marital status: Married     Spouse name: Not on file   Number of children: Not on file   Years of education: Not on file   Highest education level: Not on file  Occupational History   Not on file  Tobacco Use   Smoking status: Former    Current packs/day: 0.00    Average packs/day: 2.0 packs/day for 10.0 years (20.0 ttl pk-yrs)    Types: Cigarettes    Start date: 04/24/1960    Quit date: 04/24/1970    Years since quitting: 52.9   Smokeless tobacco: Never  Vaping Use   Vaping status: Never Used  Substance and Sexual Activity   Alcohol use: Yes    Alcohol/week: 7.0 standard drinks of alcohol    Types: 7 Cans of beer per week   Drug use: Not on file   Sexual activity: Not on file  Other Topics Concern   Not on file  Social History Narrative   Active and independent at baseline. Lives at home with wife   Social Determinants of Health   Financial Resource Strain: Low Risk  (12/05/2022)   Received from [facility]   Overall Financial Resource Strain (CARDIA)    Difficulty of Paying Living Expenses: Not hard at all  Food Insecurity: No Food Insecurity (12/05/2022)   Received [facility]   Hunger Vital Sign    Worried About Running Out of Food in the Last Year: Never true    Ran Out of Food in the Last Year: Never true  Transportation Needs: No Transportation Needs (12/05/2022)   Received from [facility] - Transportation    In the past 12 months, has lack of transportation kept you from medical appointments or from getting medications?: No    Lack of Transportation (Non-Medical): No  Physical Activity: Not on file  Stress: Not on file  Social Connections: Not on file  Intimate Partner Violence: Not on file    Review of Systems: See HPI, otherwise negative ROS  Physical Exam: BP (!) 158/74   Pulse 72   Temp 98.4 F (36.9 C) (Temporal)   Resp (!) 23   Ht 5\' 4"  (1.626 m)   Wt 69.6 kg   SpO2 99%   BMI 26.35 kg/m  General:   Alert,  cooperative in NAD Head:  Normocephalic and atraumatic. Respiratory:  Normal work of breathing. Cardiovascular:  RRR  Impression/Plan: Charles Davila is here for cataract surgery.  Risks, benefits, limitations, and alternatives regarding cataract surgery have been reviewed with the patient.  Questions have been answered.  All parties agreeable.   Willey Blade, MD  03/26/2023, 10:39 AM

## 2023-03-26 NOTE — Anesthesia Postprocedure Evaluation (Signed)
Anesthesia Post Note  Patient: Banker  Procedure(s) Performed: CATARACT EXTRACTION PHACO AND INTRAOCULAR LENS PLACEMENT (IOC) RIGHT  5.56  00:33.0 (Right: Eye)  Patient location during evaluation: PACU Anesthesia Type: MAC Level of consciousness: awake and alert Pain management: pain level controlled Vital Signs Assessment: post-procedure vital signs reviewed and stable Respiratory status: spontaneous breathing, nonlabored ventilation, respiratory function stable and patient connected to nasal cannula oxygen Cardiovascular status: stable and blood pressure returned to baseline Postop Assessment: no apparent nausea or vomiting Anesthetic complications: no   No notable events documented.   Last Vitals:  Vitals:   03/26/23 1109 03/26/23 1113  BP:  134/68  Pulse: 64 61  Resp: 10 18  Temp: 36.8 C   SpO2: 98% 100%    Last Pain:  Vitals:   03/26/23 1113  TempSrc:   PainSc: 0-No pain                 Yevette Edwards

## 2023-03-26 NOTE — Transfer of Care (Signed)
Immediate Anesthesia Transfer of Care Note  Patient: Charles Davila  Procedure(s) Performed: CATARACT EXTRACTION PHACO AND INTRAOCULAR LENS PLACEMENT (IOC) RIGHT  5.56  00:33.0 (Right: Eye)  Patient Location: PACU  Anesthesia Type: MAC  Level of Consciousness: awake, alert  and patient cooperative  Airway and Oxygen Therapy: Patient Spontanous Breathing and Patient connected to supplemental oxygen  Post-op Assessment: Post-op Vital signs reviewed, Patient's Cardiovascular Status Stable, Respiratory Function Stable, Patent Airway and No signs of Nausea or vomiting  Post-op Vital Signs: Reviewed and stable  Complications: No notable events documented.

## 2023-03-26 NOTE — Op Note (Signed)
OPERATIVE NOTE  Heliodoro Goris [id number] 03/26/2023   PREOPERATIVE DIAGNOSIS:  Nuclear sclerotic cataract right eye.  H25.11   POSTOPERATIVE DIAGNOSIS:    Nuclear sclerotic cataract right eye.     PROCEDURE:  Phacoemusification with posterior chamber intraocular lens placement of the right eye   LENS:   Implant Name Type Inv. Item Serial No. Manufacturer Lot No. LRB No. Used Action  LENS IOL TECNIS EYHANCE 18.5 - [id number] Intraocular Lens LENS IOL TECNIS EYHANCE 18.5 [id number] [employer]  Right 1 Implanted       Procedure(s): CATARACT EXTRACTION PHACO AND INTRAOCULAR LENS PLACEMENT (IOC) RIGHT  5.56  00:33.0 (Right)  SURGEON:  Willey Blade, MD, MPH  ANESTHESIOLOGIST: Anesthesiologist: Yevette Edwards, MD CRNA: Irving Burton, CRNA; Barbette Hair, CRNA   ANESTHESIA:  Topical with tetracaine drops augmented with 1% preservative-free intracameral lidocaine.  ESTIMATED BLOOD LOSS: less than 1 mL.   COMPLICATIONS:  None.   DESCRIPTION OF PROCEDURE:  The patient was identified in the holding room and transported to the operating room and placed in the supine position under the operating microscope.  The right eye was identified as the operative eye and it was prepped and draped in the usual sterile ophthalmic fashion.   A 1.0 millimeter clear-corneal paracentesis was made at the 10:30 position. 0.5 ml of preservative-free 1% lidocaine with epinephrine was injected into the anterior chamber.  The anterior chamber was filled with viscoelastic.  A 2.4 millimeter keratome was used to make a near-clear corneal incision at the 8:00 position.  A curvilinear capsulorrhexis was made with a cystotome and capsulorrhexis forceps.  Balanced salt solution was used to hydrodissect and hydrodelineate the nucleus.   Phacoemulsification was then used in stop and chop fashion to remove the lens nucleus and epinucleus.  The remaining cortex was then removed using the irrigation and aspiration  handpiece. Viscoelastic was then placed into the capsular bag to distend it for lens placement.  A lens was then injected into the capsular bag.  The remaining viscoelastic was aspirated.   Wounds were hydrated with balanced salt solution.  The anterior chamber was inflated to a physiologic pressure with balanced salt solution.   Intracameral vigamox 0.1 mL undiluted was injected into the eye and a drop placed onto the ocular surface.  No wound leaks were noted.  The patient was taken to the recovery room in stable condition without complications of anesthesia or surgery  Willey Blade 03/26/2023, 11:07 AM

## 2023-03-26 NOTE — Anesthesia Preprocedure Evaluation (Signed)
Anesthesia Evaluation  Patient identified by MRN, date of birth, ID band Patient awake    Reviewed: Allergy & Precautions, H&P , NPO status , Patient's Chart, lab work & pertinent test results, reviewed documented beta blocker date and time   Airway Mallampati: II  TM Distance: >3 FB Neck ROM: full    Dental no notable dental hx. (+) Teeth Intact   Pulmonary neg pulmonary ROS, former smoker   Pulmonary exam normal breath sounds clear to auscultation       Cardiovascular Exercise Tolerance: Good hypertension, On Medications negative cardio ROS  Rhythm:regular Rate:Normal     Neuro/Psych  Neuromuscular disease  negative psych ROS   GI/Hepatic negative GI ROS, Neg liver ROS,,,  Endo/Other  negative endocrine ROSdiabetes, Well Controlled    Renal/GU      Musculoskeletal   Abdominal   Peds  Hematology negative hematology ROS (+)   Anesthesia Other Findings   Reproductive/Obstetrics negative OB ROS                             Anesthesia Physical Anesthesia Plan  ASA: 2  Anesthesia Plan: MAC   Post-op Pain Management:    Induction:   PONV Risk Score and Plan:   Airway Management Planned:   Additional Equipment:   Intra-op Plan:   Post-operative Plan:   Informed Consent: I have reviewed the patients History and Physical, chart, labs and discussed the procedure including the risks, benefits and alternatives for the proposed anesthesia with the patient or authorized representative who has indicated his/her understanding and acceptance.       Plan Discussed with: CRNA  Anesthesia Plan Comments:        Anesthesia Quick Evaluation

## 2023-03-30 ENCOUNTER — Encounter: Payer: Self-pay | Admitting: Ophthalmology

## 2023-04-02 NOTE — Anesthesia Preprocedure Evaluation (Addendum)
Anesthesia Evaluation    Airway Mallampati: II  TM Distance: >3 FB Neck ROM: Full    Dental no notable dental hx.    Pulmonary former smoker   Pulmonary exam normal breath sounds clear to auscultation       Cardiovascular Normal cardiovascular exam Rhythm:Regular Rate:Normal     Neuro/Psych  Neuromuscular disease    GI/Hepatic   Endo/Other    Renal/GU      Musculoskeletal  (+) Arthritis ,    Abdominal   Peds  Hematology   Anesthesia Other Findings Previous cataract surgery 03-26-23 with Dr. Pernell Dupre anesthesiologist  Reproductive/Obstetrics                             Anesthesia Physical Anesthesia Plan  ASA: 2  Anesthesia Plan: MAC   Post-op Pain Management:    Induction: Intravenous  PONV Risk Score and Plan:   Airway Management Planned: Natural Airway and Nasal Cannula  Additional Equipment:   Intra-op Plan:   Post-operative Plan:   Informed Consent: I have reviewed the patients History and Physical, chart, labs and discussed the procedure including the risks, benefits and alternatives for the proposed anesthesia with the patient or authorized representative who has indicated his/her understanding and acceptance.     Dental Advisory Given  Plan Discussed with: Anesthesiologist, CRNA and Surgeon  Anesthesia Plan Comments: (Patient consented for risks of anesthesia including but not limited to:  - adverse reactions to medications - damage to eyes, teeth, lips or other oral mucosa - nerve damage due to positioning  - sore throat or hoarseness - Damage to heart, brain, nerves, lungs, other parts of body or loss of life  Patient voiced understanding and assent.)        Anesthesia Quick Evaluation

## 2023-04-05 NOTE — Discharge Instructions (Signed)

## 2023-04-09 ENCOUNTER — Encounter
Admission: RE | Disposition: A | Discharge status: Home / Self Care | Payer: Self-pay | Source: Home / Self Care | Attending: Ophthalmology

## 2023-04-09 ENCOUNTER — Other Ambulatory Visit: Payer: Self-pay

## 2023-04-09 ENCOUNTER — Ambulatory Visit
Admission: RE | Admit: 2023-04-09 | Discharge: 2023-04-09 | Disposition: A | Discharge status: Home / Self Care | Payer: Medicare Other | Attending: Ophthalmology | Admitting: Ophthalmology

## 2023-04-09 ENCOUNTER — Ambulatory Visit: Payer: Medicare Other | Admitting: Anesthesiology

## 2023-04-09 ENCOUNTER — Encounter: Payer: Self-pay | Admitting: Ophthalmology

## 2023-04-09 DIAGNOSIS — Z9841 Cataract extraction status, right eye: Secondary | ICD-10-CM | POA: Insufficient documentation

## 2023-04-09 DIAGNOSIS — Z961 Presence of intraocular lens: Secondary | ICD-10-CM | POA: Insufficient documentation

## 2023-04-09 DIAGNOSIS — Z87891 Personal history of nicotine dependence: Secondary | ICD-10-CM | POA: Insufficient documentation

## 2023-04-09 DIAGNOSIS — H2512 Age-related nuclear cataract, left eye: Secondary | ICD-10-CM | POA: Diagnosis present

## 2023-04-09 HISTORY — PX: CATARACT EXTRACTION W/PHACO: SHX586

## 2023-04-09 SURGERY — PHACOEMULSIFICATION, CATARACT, WITH IOL INSERTION
Anesthesia: Monitor Anesthesia Care | Site: Eye | Laterality: Left

## 2023-04-09 MED ORDER — TETRACAINE HCL 0.5 % OP SOLN
OPHTHALMIC | Status: AC
  Filled 2023-04-09: qty 4

## 2023-04-09 MED ORDER — SIGHTPATH DOSE#1 NA HYALUR & NA CHOND-NA HYALUR IO KIT
PACK | INTRAOCULAR | Status: DC | PRN
  Administered 2023-04-09: 1 via OPHTHALMIC

## 2023-04-09 MED ORDER — MOXIFLOXACIN HCL 0.5 % OP SOLN
OPHTHALMIC | Status: DC | PRN
  Administered 2023-04-09: .2 mL via OPHTHALMIC

## 2023-04-09 MED ORDER — ARMC OPHTHALMIC DILATING DROPS
1.0000 | OPHTHALMIC | Status: DC | PRN
  Administered 2023-04-09 (×3): 1 via OPHTHALMIC

## 2023-04-09 MED ORDER — SIGHTPATH DOSE#1 BSS IO SOLN
INTRAOCULAR | Status: DC | PRN
  Administered 2023-04-09: 98 mL via OPHTHALMIC

## 2023-04-09 MED ORDER — MIDAZOLAM HCL 2 MG/2ML IJ SOLN
INTRAMUSCULAR | Status: DC | PRN
  Administered 2023-04-09: 2 mg via INTRAVENOUS

## 2023-04-09 MED ORDER — SIGHTPATH DOSE#1 BSS IO SOLN
INTRAOCULAR | Status: DC | PRN
  Administered 2023-04-09: 15 mL

## 2023-04-09 MED ORDER — MIDAZOLAM HCL 2 MG/2ML IJ SOLN
INTRAMUSCULAR | Status: AC
  Filled 2023-04-09: qty 2

## 2023-04-09 MED ORDER — TETRACAINE HCL 0.5 % OP SOLN
1.0000 [drp] | OPHTHALMIC | Status: DC | PRN
  Administered 2023-04-09 (×3): 1 [drp] via OPHTHALMIC

## 2023-04-09 MED ORDER — LIDOCAINE HCL (PF) 2 % IJ SOLN
INTRAOCULAR | Status: DC | PRN
  Administered 2023-04-09: 1 mL via INTRAOCULAR

## 2023-04-09 SURGICAL SUPPLY — 11 items
CATARACT SUITE SIGHTPATH (MISCELLANEOUS) ×1
DISSECTOR HYDRO NUCLEUS 50X22 (MISCELLANEOUS) ×1 IMPLANT
FEE CATARACT SUITE SIGHTPATH (MISCELLANEOUS) ×1 IMPLANT
GLOVE PI ULTRA LF STRL 7.5 (GLOVE) ×1 IMPLANT
GLOVE SURG POLYISOPRENE 8.5 (GLOVE) ×1
GLOVE SURG SYN 8.5 PF PI BL (GLOVE) ×1 IMPLANT
LENS IOL TECNIS EYHANCE 19.0 (Intraocular Lens) IMPLANT
NDL FILTER BLUNT 18X1 1/2 (NEEDLE) ×1 IMPLANT
NEEDLE FILTER BLUNT 18X1 1/2 (NEEDLE) ×1
SYR 3ML LL SCALE MARK (SYRINGE) ×1 IMPLANT
SYR 5ML LL (SYRINGE) ×1 IMPLANT

## 2023-04-09 NOTE — Transfer of Care (Signed)
Immediate Anesthesia Transfer of Care Note  Patient: Charles Davila  Procedure(s) Performed: CATARACT EXTRACTION PHACO AND INTRAOCULAR LENS PLACEMENT (IOC) LEFT  6.68  00:37.3 (Left: Eye)  Patient Location: PACU  Anesthesia Type: MAC  Level of Consciousness: awake, alert  and patient cooperative  Airway and Oxygen Therapy: Patient Spontanous Breathing and Patient connected to supplemental oxygen  Post-op Assessment: Post-op Vital signs reviewed, Patient's Cardiovascular Status Stable, Respiratory Function Stable, Patent Airway and No signs of Nausea or vomiting  Post-op Vital Signs: Reviewed and stable  Complications: No notable events documented.

## 2023-04-09 NOTE — H&P (Signed)
[facility]   Primary Care Physician:  Lynnea Ferrier, MD Ophthalmologist: Dr. Willey Blade  Pre-Procedure History & Physical: HPI:  Charles Davila is a 82 y.o. male here for cataract surgery.   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[facility])     Past Surgical History:  Procedure Laterality Date   APPENDECTOMY     CATARACT EXTRACTION W/PHACO Right 03/26/2023   Procedure: CATARACT EXTRACTION PHACO AND INTRAOCULAR LENS PLACEMENT (IOC) RIGHT  5.56  00:33.0;  Surgeon: Nevada Crane, MD;  Location: [facility];  Service: Ophthalmology;  Laterality: Right;   ESOPHAGOGASTRODUODENOSCOPY N/A 04/11/2015   Procedure: ESOPHAGOGASTRODUODENOSCOPY (EGD);  Surgeon: Scot Jun, MD;  Location: [facility] ENDOSCOPY;  Service: Endoscopy;  Laterality: N/A;   ESOPHAGOGASTRODUODENOSCOPY N/A 04/12/2015   Procedure: ESOPHAGOGASTRODUODENOSCOPY (EGD);  Surgeon: Wallace Cullens, MD;  Location: [facility] ENDOSCOPY;  Service: Endoscopy;  Laterality: N/A;    Prior to Admission medications   Medication Sig Start Date End Date Taking? Authorizing Provider  acetaminophen (TYLENOL) 650 MG CR tablet Take 650 mg by mouth every 6 (six) hours as needed for pain.   Yes [provider]  albuterol (VENTOLIN HFA) 108 (90 Base) MCG/ACT inhaler Inhale into the lungs. 02/27/20  Yes [provider]  azaTHIOprine (IMURAN) 50 MG tablet Take 125 mg by mouth daily.   Yes [provider]  Cyanocobalamin (VITAMIN B-12 IJ) Inject as directed every 14 (fourteen) days.   Yes [provider]  ipratropium (ATROVENT HFA) 17 MCG/ACT inhaler Inhale 2 puffs into the lungs every 6 (six) hours.   Yes [provider]  OXYGEN Inhale into the lungs at bedtime as needed.   Yes [provider]  pantoprazole (PROTONIX) 40 MG tablet Take 1 tablet by mouth daily. 09/17/19  Yes [provider]  predniSONE (DELTASONE) 5 MG tablet  Take 5 mg by mouth daily with breakfast.   Yes [provider]    Allergies as of 02/19/2023   (No Known Allergies)    History reviewed. No pertinent family history.  Social History   Socioeconomic History   Marital status: Married    Spouse name: Not on file   Number of children: Not on file   Years of education: Not on file   Highest education level: Not on file  Occupational History   Not on file  Tobacco Use   Smoking status: Former    Current packs/day: 0.00    Average packs/day: 2.0 packs/day for 10.0 years (20.0 ttl pk-yrs)    Types: Cigarettes    Start date: 04/24/1960    Quit date: 04/24/1970    Years since quitting: 52.9   Smokeless tobacco: Never  Vaping Use   Vaping status: Never Used  Substance and Sexual Activity   Alcohol use: Yes    Alcohol/week: 7.0 standard drinks of alcohol    Types: 7 Cans of beer per week   Drug use: Not on file   Sexual activity: Not on file  Other Topics Concern   Not on file  Social History Narrative   Active and independent at baseline. Lives at home with wife   Social Drivers of Health   Financial Resource Strain: Low Risk  (12/05/2022)   Received from [facility]   Overall Financial Resource Strain (CARDIA)    Difficulty of Paying Living Expenses: Not hard at all  Food Insecurity: No Food Insecurity (12/05/2022)  Received from [facility]   Hunger Vital Sign    Worried About Running Out of Food in the Last Year: Never true    Ran Out of Food in the Last Year: Never true  Transportation Needs: No Transportation Needs (12/05/2022)   Received from [facility] - Transportation    In the past 12 months, has lack of transportation kept you from medical appointments or from getting medications?: No    Lack of Transportation (Non-Medical): No  Physical Activity: Not on file  Stress: Not on file  Social Connections: Not on file  Intimate Partner Violence:  Not on file    Review of Systems: See HPI, otherwise negative ROS  Physical Exam: BP (!) 163/83   Pulse 74   Temp 97.9 F (36.6 C) (Temporal)   Resp 20   Ht 5\' 4"  (1.626 m)   Wt 70.8 kg   SpO2 98%   BMI 26.78 kg/m  General:   Alert, cooperative in NAD Head:  Normocephalic and atraumatic. Respiratory:  Normal work of breathing. Cardiovascular:  RRR  Impression/Plan: Charles Davila is here for cataract surgery.  Risks, benefits, limitations, and alternatives regarding cataract surgery have been reviewed with the patient.  Questions have been answered.  All parties agreeable.   Willey Blade, MD  04/09/2023, 8:16 AM

## 2023-04-09 NOTE — Op Note (Signed)
OPERATIVE NOTE  Charles Davila [id number] 04/09/2023   PREOPERATIVE DIAGNOSIS:  Nuclear sclerotic cataract left eye.  H25.12   POSTOPERATIVE DIAGNOSIS:    Nuclear sclerotic cataract left eye.     PROCEDURE:  Phacoemusification with posterior chamber intraocular lens placement of the left eye   LENS:   Implant Name Type Inv. Item Serial No. Manufacturer Lot No. LRB No. Used Action  LENS IOL TECNIS EYHANCE 19.0 - [id number] Intraocular Lens LENS IOL TECNIS EYHANCE 19.0 [id number] [employer]  Left 1 Implanted      Procedure(s): CATARACT EXTRACTION PHACO AND INTRAOCULAR LENS PLACEMENT (IOC) LEFT  6.68  00:37.3 (Left)  SURGEON:  Willey Blade, MD, MPH   ANESTHESIA:  Topical with tetracaine drops augmented with 1% preservative-free intracameral lidocaine.  ESTIMATED BLOOD LOSS: <1 mL   COMPLICATIONS:  None.   DESCRIPTION OF PROCEDURE:  The patient was identified in the holding room and transported to the operating room and placed in the supine position under the operating microscope.  The left eye was identified as the operative eye and it was prepped and draped in the usual sterile ophthalmic fashion.   A 1.0 millimeter clear-corneal paracentesis was made at the 5:00 position. 0.5 ml of preservative-free 1% lidocaine with epinephrine was injected into the anterior chamber.  The anterior chamber was filled with viscoelastic.  A 2.4 millimeter keratome was used to make a near-clear corneal incision at the 2:00 position.  A curvilinear capsulorrhexis was made with a cystotome and capsulorrhexis forceps.  Balanced salt solution was used to hydrodissect and hydrodelineate the nucleus.   Phacoemulsification was then used in stop and chop fashion to remove the lens nucleus and epinucleus.  The remaining cortex was then removed using the irrigation and aspiration handpiece. Viscoelastic was then placed into the capsular bag to distend it for lens placement.  A lens was then injected into the  capsular bag.  The remaining viscoelastic was aspirated.   Wounds were hydrated with balanced salt solution.  The anterior chamber was inflated to a physiologic pressure with balanced salt solution.  Intracameral vigamox 0.1 mL undiltued was injected into the eye and a drop placed onto the ocular surface.  No wound leaks were noted.  The patient was taken to the recovery room in stable condition without complications of anesthesia or surgery  Willey Blade 04/09/2023, 8:37 AM

## 2023-04-09 NOTE — Anesthesia Postprocedure Evaluation (Signed)
Anesthesia Post Note  Patient: Banker  Procedure(s) Performed: CATARACT EXTRACTION PHACO AND INTRAOCULAR LENS PLACEMENT (IOC) LEFT  6.68  00:37.3 (Left: Eye)  Patient location during evaluation: PACU Anesthesia Type: MAC Level of consciousness: awake and alert Pain management: pain level controlled Vital Signs Assessment: post-procedure vital signs reviewed and stable Respiratory status: spontaneous breathing, nonlabored ventilation, respiratory function stable and patient connected to nasal cannula oxygen Cardiovascular status: stable and blood pressure returned to baseline Postop Assessment: no apparent nausea or vomiting Anesthetic complications: no   No notable events documented.   Last Vitals:  Vitals:   04/09/23 0840 04/09/23 0842  BP:  118/73  Pulse: 67 66  Resp: 20 17  Temp:    SpO2: 97% 97%    Last Pain:  Vitals:   04/09/23 0838  TempSrc:   PainSc: 0-No pain                 Marisue Humble

## 2023-04-10 ENCOUNTER — Encounter: Payer: Self-pay | Admitting: Ophthalmology

## 2023-12-14 ENCOUNTER — Other Ambulatory Visit: Payer: Self-pay | Admitting: Internal Medicine

## 2023-12-14 DIAGNOSIS — R030 Elevated blood-pressure reading, without diagnosis of hypertension: Secondary | ICD-10-CM

## 2023-12-14 DIAGNOSIS — R1031 Right lower quadrant pain: Secondary | ICD-10-CM

## 2023-12-18 ENCOUNTER — Ambulatory Visit
Admission: RE | Admit: 2023-12-18 | Discharge: 2023-12-18 | Disposition: A | Discharge status: Home / Self Care | Source: Ambulatory Visit | Attending: Internal Medicine | Admitting: Internal Medicine

## 2023-12-18 DIAGNOSIS — R1031 Right lower quadrant pain: Secondary | ICD-10-CM | POA: Insufficient documentation

## 2023-12-18 DIAGNOSIS — R030 Elevated blood-pressure reading, without diagnosis of hypertension: Secondary | ICD-10-CM | POA: Insufficient documentation
